# Patient Record
Sex: Male | Born: 1950
Health system: Southern US, Community
[De-identification: ages and names within clinical notes are randomized; demographics above are authoritative.]

## PROBLEM LIST (undated history)

## (undated) DIAGNOSIS — W868XXA Exposure to other electric current, initial encounter: Secondary | ICD-10-CM

## (undated) DIAGNOSIS — F329 Major depressive disorder, single episode, unspecified: Secondary | ICD-10-CM

## (undated) DIAGNOSIS — N200 Calculus of kidney: Secondary | ICD-10-CM

## (undated) DIAGNOSIS — I10 Essential (primary) hypertension: Secondary | ICD-10-CM

## (undated) DIAGNOSIS — F419 Anxiety disorder, unspecified: Secondary | ICD-10-CM

## (undated) DIAGNOSIS — G629 Polyneuropathy, unspecified: Secondary | ICD-10-CM

## (undated) DIAGNOSIS — M51369 Other intervertebral disc degeneration, lumbar region without mention of lumbar back pain or lower extremity pain: Secondary | ICD-10-CM

## (undated) DIAGNOSIS — F32A Depression, unspecified: Secondary | ICD-10-CM

## (undated) DIAGNOSIS — K08109 Complete loss of teeth, unspecified cause, unspecified class: Secondary | ICD-10-CM

## (undated) DIAGNOSIS — M5136 Other intervertebral disc degeneration, lumbar region: Secondary | ICD-10-CM

## (undated) DIAGNOSIS — Z87442 Personal history of urinary calculi: Secondary | ICD-10-CM

## (undated) DIAGNOSIS — K000 Anodontia: Secondary | ICD-10-CM

## (undated) DIAGNOSIS — S0990XA Unspecified injury of head, initial encounter: Secondary | ICD-10-CM

## (undated) DIAGNOSIS — K5790 Diverticulosis of intestine, part unspecified, without perforation or abscess without bleeding: Secondary | ICD-10-CM

## (undated) DIAGNOSIS — E7800 Pure hypercholesterolemia, unspecified: Secondary | ICD-10-CM

## (undated) HISTORY — DX: Polyneuropathy, unspecified: G62.9

## (undated) HISTORY — PX: TONSILLECTOMY: SUR1361

## (undated) HISTORY — PX: HERNIA REPAIR: SHX51

## (undated) HISTORY — PX: OTHER SURGICAL HISTORY: SHX169

---

## 2005-11-10 DIAGNOSIS — Y990 Civilian activity done for income or pay: Secondary | ICD-10-CM

## 2005-11-10 HISTORY — DX: Civilian activity done for income or pay: Y99.0

## 2006-05-12 DIAGNOSIS — W868XXA Exposure to other electric current, initial encounter: Secondary | ICD-10-CM

## 2006-05-12 HISTORY — DX: Exposure to other electric current, initial encounter: W86.8XXA

## 2006-08-25 ENCOUNTER — Ambulatory Visit (HOSPITAL_COMMUNITY): Admission: RE | Admit: 2006-08-25 | Discharge: 2006-08-25 | Payer: Self-pay | Admitting: Pulmonary Disease

## 2006-09-08 ENCOUNTER — Encounter (HOSPITAL_COMMUNITY): Admission: RE | Admit: 2006-09-08 | Discharge: 2006-10-08 | Payer: Self-pay

## 2006-10-12 ENCOUNTER — Encounter (HOSPITAL_COMMUNITY): Admission: RE | Admit: 2006-10-12 | Discharge: 2006-11-09 | Payer: Self-pay

## 2006-11-30 ENCOUNTER — Encounter (HOSPITAL_COMMUNITY): Admission: RE | Admit: 2006-11-30 | Discharge: 2006-12-30 | Payer: Self-pay

## 2008-05-05 ENCOUNTER — Emergency Department (HOSPITAL_COMMUNITY): Admission: EM | Admit: 2008-05-05 | Discharge: 2008-05-05 | Payer: Self-pay | Admitting: Emergency Medicine

## 2008-08-15 ENCOUNTER — Emergency Department (HOSPITAL_COMMUNITY): Admission: EM | Admit: 2008-08-15 | Discharge: 2008-08-15 | Payer: Self-pay | Admitting: Emergency Medicine

## 2011-08-11 LAB — DIFFERENTIAL
Lymphs Abs: 2.7
Monocytes Relative: 3
Neutro Abs: 9.4 — ABNORMAL HIGH
Neutrophils Relative %: 75

## 2011-08-11 LAB — BASIC METABOLIC PANEL
Calcium: 9.1
Chloride: 106
Creatinine, Ser: 0.97
GFR calc Af Amer: >60
Sodium: 140

## 2011-08-11 LAB — CBC
MCV: 89.3
RBC: 5.34
WBC: 12.5 — ABNORMAL HIGH

## 2011-08-11 LAB — POCT CARDIAC MARKERS
CKMB, poc: 1
CKMB, poc: <1 — ABNORMAL LOW
Troponin i, poc: <0.05
Troponin i, poc: <0.05

## 2012-01-07 ENCOUNTER — Encounter (HOSPITAL_COMMUNITY): Payer: Self-pay

## 2012-01-07 ENCOUNTER — Encounter (HOSPITAL_COMMUNITY): Payer: Self-pay | Admitting: Pharmacy Technician

## 2012-01-07 ENCOUNTER — Encounter (HOSPITAL_COMMUNITY)
Admission: RE | Admit: 2012-01-07 | Discharge: 2012-01-07 | Disposition: A | Discharge status: Home / Self Care | Payer: Medicare Other | Source: Ambulatory Visit | Attending: Ophthalmology | Admitting: Ophthalmology

## 2012-01-07 ENCOUNTER — Other Ambulatory Visit: Payer: Self-pay

## 2012-01-07 HISTORY — DX: Unspecified injury of head, initial encounter: S09.90XA

## 2012-01-07 HISTORY — DX: Anxiety disorder, unspecified: F41.9

## 2012-01-07 HISTORY — DX: Pure hypercholesterolemia, unspecified: E78.00

## 2012-01-07 HISTORY — DX: Essential (primary) hypertension: I10

## 2012-01-07 HISTORY — DX: Major depressive disorder, single episode, unspecified: F32.9

## 2012-01-07 HISTORY — DX: Complete loss of teeth, unspecified cause, unspecified class: K08.109

## 2012-01-07 HISTORY — DX: Exposure to other electric current, initial encounter: W86.8XXA

## 2012-01-07 HISTORY — DX: Depression, unspecified: F32.A

## 2012-01-07 HISTORY — DX: Anodontia: K00.0

## 2012-01-07 LAB — HEMOGLOBIN AND HEMATOCRIT, BLOOD: Hemoglobin: 15.3 g/dL (ref 13.0–17.0)

## 2012-01-07 LAB — BASIC METABOLIC PANEL
Calcium: 9 mg/dL (ref 8.4–10.5)
Creatinine, Ser: 0.87 mg/dL (ref 0.50–1.35)
GFR calc Af Amer: >90 mL/min (ref >90)
GFR calc non Af Amer: >90 mL/min (ref >90)

## 2012-01-07 NOTE — Patient Instructions (Addendum)
[address] CORDEL Meza  01/07/2012   Your procedure is scheduled on:  THursday, 01/15/12  Report to [facility] at 0700 AM.  Call this number if you have problems the morning of surgery: [phone]   Remember:   Do not eat food:After Midnight.  May have clear liquids:until Midnight .  Clear liquids include soda, tea, black coffee, apple or grape juice, broth.  Take these medicines the morning of surgery with A SIP OF WATER: lexapro, norvasc, tegretol, wellbutrin   Do not wear jewelry, make-up or nail polish.  Do not wear lotions, powders, or perfumes. You may wear deodorant.  Do not bring valuables to the hospital.  Contacts, dentures or bridgework may not be worn into surgery.    Patients discharged the day of surgery will not be allowed to drive home.  Name and phone number of your driver: driver  Special Instructions: Use eye drops as directed   Please read over the following fact sheets that you were given: Anesthesia Post-op Instructions and Care and Recovery After Surgery   Cataract A cataract is a clouding of the lens of the eye. It is most often related to aging. A cataract is not a "film" over the surface of the eye. The lens is inside the eye and changes size of the pupil. The lens can enlarge to let more light enter the eye in dark environments and contract the size of the pupil to let in bright light. The lens is the part of the eye that helps focus light on the retina. The retina is the eye's light-sensitive layer. It is in the back of the eye that sends visual signals to the brain. In a normal eye, light passes through the lens and gets focused on the retina. To help produce a sharp image, the lens must remain clear. When a lens becomes cloudy, vision is compromised by the degree and nature of the clouding. Certain cataracts make people more near-sighted as they develop, others increase glare, and all reduce vision to some degree or another. A cataract that is so dense that it becomes  milky white and a white opacity can be seen through the pupil. When the white color is seen, it is called a "mature" or "hyper-mature cataract." Such cataracts cause total blindness in the affected eye. The cataract must be removed to prevent damage to the eye itself. Some types of cataracts can cause a secondary disease of the eye, such as certain types of glaucoma. In the early stages, better lighting and eyeglasses may lessen vision problems caused by cataracts. At a certain point, surgery may be needed to improve vision. CAUSES   Aging. However, cataracts may occur at any age, even in newborns.   Certain drugs.   Trauma to the eye.   Certain diseases (such as diabetes).   Inherited or acquired medical syndromes.  SYMPTOMS   Gradual, progressive drop in vision in the affected eye. Cataracts may develop at different rates in each eye. Cataracts may even be in just one eye with the other unaffected.   Cataracts due to trauma may develop quickly, sometimes over a matter or days or even hours. The result is severe and rapid visual loss.  DIAGNOSIS  To detect a cataract, an eye doctor examines the lens. A well developed cataract can be diagnosed without dilating the pupil. Early cataracts and others of a specific nature are best diagnosed with an exam of the eyes with the pupils dilated by drops. TREATMENT  For an early cataract, vision may improve by using different eyeglasses or stronger lighting.   If the above measures do not help, surgery is the only effective treatment. This treatment removes the cloudy lens and replaces it with a substitute lens (Intraocular lens, or IOL). Newly developed IOL technology allows the implanted lens to improve vision both at a distance and up close. Discuss with your eye surgeon about the possibility of still needing glasses. Also discuss how visual coordination between both eyes will be affected.  A cataract needs to be removed only when vision loss  interferes with your everyday activities such as driving, reading or watching TV. You and your eye doctor can make that decision together. In most cases, waiting until you are ready to have cataract surgery will not harm your eye. If you have cataracts in both eyes, only one should be removed at a time. This allows the operated eye to heal and be out of danger from serious problems (such as infection or poor wound healing) before having the other eye undergo surgery.  Sometimes, a cataract should be removed even if it does not cause problems with your vision. For example, a cataract should be removed if it prevents examination or treatment of another eye problem. Just as you cannot see out of the affected eye well, your doctor cannot see into your eye well through a cataract. The vast majority of people who have cataract surgery have better vision afterward. CATARACT REMOVAL There are two primary ways to remove a cataract. Your doctor can explain the differences and help determine which is best for you:  Phacoemulsification (small incision cataract surgery). This involves making a small cut (incision) on the edge of the clear, dome-shaped surface that covers the front of the eye (the cornea). An injection behind the eye or eye drops are given to make this a painless procedure. The doctor then inserts a tiny probe into the eye. This device emits ultrasound waves that soften and break up the cloudy center of the lens so it can be removed by suction. Most cataract surgery is done this way. The cuts are usually so small and performed in such a manner that often no sutures are needed to keep it closed.   Extracapsular surgery. Your doctor makes a slightly longer incision on the side of the cornea. The doctor removes the hard center of the lens. The remainder of the lens is then removed by suction. In some cases, extremely fine sutures are needed which the doctor may, or may not remove in the office after the  surgery.  When an IOL is implanted, it needs no care. It becomes a permanent part of your eye and cannot be seen or felt.  Some people cannot have an IOL. They may have problems during surgery, or maybe they have another eye disease. For these people, a soft contact lens may be suggested. If an IOL or contact lens cannot be used, very powerful and thick glasses are required after surgery. Since vision is very different through such thick glasses, it is important to have your doctor discuss the impact on your vision after any cataract surgery where there is no plan to implant an IOL. The normal lens of the eye is covered by a clear capsule. Both phacoemulsification and extracapsular surgery require that the back surface of this lens capsule be left in place. This helps support IOLs and prevents the IOL from dislocating and falling back into the deeper interior of the eye.  Right after surgery, and often permanently this "posterior capsule" remains clear. In some cases however, it can become cloudy, presenting the same type of visual compromise that the original cataract did since light is again obstructed as it passes through the clear IOL. This condition is often referred to as an "after-cataract." Fortunately, after-cataracts are easily treated using a painless and very fast laser treatment that is performed without anesthesia or incisions. It is done in a matter of minutes in an outpatient environment. Visual improvement is often immediate.  HOME CARE INSTRUCTIONS   Your surgeon will discuss pre and post operative care with you prior to surgery. The majority of people are able to do almost all normal activities right away. Although, it is often advised to avoid strenuous activity for a period of time.   Postoperative drops and careful avoidance of infection will be needed. Many surgeons suggest the use of a protective shield during the first few days after surgery.   There is a very small incidence of  complication from modern cataract surgery, but it can happen. Infection that spreads to the inside of the eye (endophthalmitis) can result in total visual loss and even loss of the eye itself. In extremely rare instances, the inflammation of endophthalmitis can spread to both eyes (sympathetic ophthalmia). Appropriate post-operative care under the close observation of your surgeon is essential to a successful outcome.  SEEK IMMEDIATE MEDICAL CARE IF:   You have any sudden drop of vision in the operated eye.   You have pain in the operated eye.   You see a large number of floating dots in the field of vision in the operated eye.   You see flashing lights, or if a portion of your side vision in any direction appears black (like a curtain being drawn into your field of vision) in the operated eye.  Document Released: 10/27/2005 Document Revised: 07/09/2011 Document Reviewed: 12/13/2007 [facility] Patient Information 2012 [place], [place].

## 2012-01-14 MED ORDER — TETRACAINE HCL 0.5 % OP SOLN
OPHTHALMIC | Status: AC
  Administered 2012-01-15: 1 [drp] via OPHTHALMIC
  Filled 2012-01-14: qty 2

## 2012-01-14 MED ORDER — NEOMYCIN-POLYMYXIN-DEXAMETH 3.5-10000-0.1 OP OINT
TOPICAL_OINTMENT | OPHTHALMIC | Status: AC
  Filled 2012-01-14: qty 3.5

## 2012-01-14 MED ORDER — LIDOCAINE HCL (PF) 1 % IJ SOLN
INTRAMUSCULAR | Status: AC
  Filled 2012-01-14: qty 2

## 2012-01-14 MED ORDER — CYCLOPENTOLATE-PHENYLEPHRINE 0.2-1 % OP SOLN
OPHTHALMIC | Status: AC
  Administered 2012-01-15: 1 [drp] via OPHTHALMIC
  Filled 2012-01-14: qty 2

## 2012-01-14 MED ORDER — LIDOCAINE HCL 3.5 % OP GEL
OPHTHALMIC | Status: AC
  Administered 2012-01-15: 1 via OPHTHALMIC
  Filled 2012-01-14: qty 5

## 2012-01-15 ENCOUNTER — Ambulatory Visit (HOSPITAL_COMMUNITY)
Admission: RE | Admit: 2012-01-15 | Discharge: 2012-01-15 | Disposition: A | Discharge status: Home / Self Care | Payer: Medicare Other | Source: Ambulatory Visit | Attending: Ophthalmology | Admitting: Ophthalmology

## 2012-01-15 ENCOUNTER — Ambulatory Visit (HOSPITAL_COMMUNITY): Payer: Medicare Other | Admitting: Anesthesiology

## 2012-01-15 ENCOUNTER — Encounter (HOSPITAL_COMMUNITY): Payer: Self-pay | Admitting: Anesthesiology

## 2012-01-15 ENCOUNTER — Encounter (HOSPITAL_COMMUNITY): Payer: Self-pay | Admitting: *Deleted

## 2012-01-15 ENCOUNTER — Encounter (HOSPITAL_COMMUNITY)
Admission: RE | Disposition: A | Discharge status: Home / Self Care | Payer: Self-pay | Source: Ambulatory Visit | Attending: Ophthalmology

## 2012-01-15 DIAGNOSIS — I10 Essential (primary) hypertension: Secondary | ICD-10-CM | POA: Insufficient documentation

## 2012-01-15 DIAGNOSIS — Z79899 Other long term (current) drug therapy: Secondary | ICD-10-CM | POA: Insufficient documentation

## 2012-01-15 DIAGNOSIS — Z01812 Encounter for preprocedural laboratory examination: Secondary | ICD-10-CM | POA: Insufficient documentation

## 2012-01-15 DIAGNOSIS — Z01810 Encounter for preprocedural cardiovascular examination: Secondary | ICD-10-CM | POA: Insufficient documentation

## 2012-01-15 DIAGNOSIS — IMO0002 Reserved for concepts with insufficient information to code with codable children: Secondary | ICD-10-CM | POA: Insufficient documentation

## 2012-01-15 HISTORY — PX: CATARACT EXTRACTION W/PHACO: SHX586

## 2012-01-15 SURGERY — PHACOEMULSIFICATION, CATARACT, WITH IOL INSERTION
Anesthesia: Monitor Anesthesia Care | Site: Eye | Laterality: Left | Wound class: Clean

## 2012-01-15 MED ORDER — LIDOCAINE HCL 3.5 % OP GEL
1.0000 "application " | Freq: Once | OPHTHALMIC | Status: AC
  Administered 2012-01-15: 1 via OPHTHALMIC

## 2012-01-15 MED ORDER — TETRACAINE HCL 0.5 % OP SOLN
1.0000 [drp] | OPHTHALMIC | Status: AC
  Administered 2012-01-15 (×3): 1 [drp] via OPHTHALMIC

## 2012-01-15 MED ORDER — PHENYLEPHRINE HCL 2.5 % OP SOLN
OPHTHALMIC | Status: AC
  Administered 2012-01-15: 1 [drp] via OPHTHALMIC
  Filled 2012-01-15: qty 2

## 2012-01-15 MED ORDER — BSS IO SOLN
INTRAOCULAR | Status: DC | PRN
  Administered 2012-01-15: 15 mL via INTRAOCULAR

## 2012-01-15 MED ORDER — PHENYLEPHRINE HCL 2.5 % OP SOLN
1.0000 [drp] | OPHTHALMIC | Status: AC
  Administered 2012-01-15 (×3): 1 [drp] via OPHTHALMIC

## 2012-01-15 MED ORDER — NEOMYCIN-POLYMYXIN-DEXAMETH 0.1 % OP OINT
TOPICAL_OINTMENT | OPHTHALMIC | Status: DC | PRN
  Administered 2012-01-15: 1 via OPHTHALMIC

## 2012-01-15 MED ORDER — POVIDONE-IODINE 5 % OP SOLN
OPHTHALMIC | Status: DC | PRN
  Administered 2012-01-15: 1 via OPHTHALMIC

## 2012-01-15 MED ORDER — EPINEPHRINE HCL 1 MG/ML IJ SOLN
INTRAMUSCULAR | Status: AC
  Filled 2012-01-15: qty 1

## 2012-01-15 MED ORDER — TRYPAN BLUE 0.06 % OP SOLN
OPHTHALMIC | Status: AC
  Filled 2012-01-15: qty 0.5

## 2012-01-15 MED ORDER — MIDAZOLAM HCL 2 MG/2ML IJ SOLN
1.0000 mg | INTRAMUSCULAR | Status: DC | PRN
  Administered 2012-01-15: 2 mg via INTRAVENOUS

## 2012-01-15 MED ORDER — ONDANSETRON HCL 4 MG/2ML IJ SOLN: 4.0000 mg | Freq: Once | INTRAMUSCULAR | Status: DC | PRN

## 2012-01-15 MED ORDER — TRYPAN BLUE 0.06 % OP SOLN
OPHTHALMIC | Status: DC | PRN
  Administered 2012-01-15: 0.5 mL via INTRAOCULAR

## 2012-01-15 MED ORDER — LACTATED RINGERS IV SOLN
INTRAVENOUS | Status: DC
  Administered 2012-01-15: 08:00:00 via INTRAVENOUS

## 2012-01-15 MED ORDER — MIDAZOLAM HCL 2 MG/2ML IJ SOLN
INTRAMUSCULAR | Status: AC
  Administered 2012-01-15: 2 mg via INTRAVENOUS
  Filled 2012-01-15: qty 2

## 2012-01-15 MED ORDER — PROVISC 10 MG/ML IO SOLN: INTRAOCULAR | Status: DC | PRN

## 2012-01-15 MED ORDER — EPINEPHRINE HCL 1 MG/ML IJ SOLN
INTRAOCULAR | Status: DC | PRN
  Administered 2012-01-15: 08:00:00

## 2012-01-15 MED ORDER — LIDOCAINE HCL (PF) 1 % IJ SOLN
INTRAMUSCULAR | Status: DC | PRN
  Administered 2012-01-15: .5 mL

## 2012-01-15 MED ORDER — NA HYALUR & NA CHOND-NA HYALUR 0.55-0.5 ML IO KIT
PACK | INTRAOCULAR | Status: DC | PRN
  Administered 2012-01-15: 1 via OPHTHALMIC

## 2012-01-15 MED ORDER — CYCLOPENTOLATE-PHENYLEPHRINE 0.2-1 % OP SOLN
1.0000 [drp] | OPHTHALMIC | Status: AC
  Administered 2012-01-15 (×3): 1 [drp] via OPHTHALMIC

## 2012-01-15 MED ORDER — FENTANYL CITRATE 0.05 MG/ML IJ SOLN: 25.0000 ug | INTRAMUSCULAR | Status: DC | PRN

## 2012-01-15 MED ORDER — LIDOCAINE 3.5 % OP GEL OPTIME - NO CHARGE
OPHTHALMIC | Status: DC | PRN
  Administered 2012-01-15: 2 [drp] via OPHTHALMIC

## 2012-01-15 SURGICAL SUPPLY — 32 items
CAPSULAR TENSION RING-AMO (OPHTHALMIC RELATED) IMPLANT
CLOTH BEACON ORANGE TIMEOUT ST (SAFETY) ×2 IMPLANT
EYE SHIELD UNIVERSAL CLEAR (GAUZE/BANDAGES/DRESSINGS) ×2 IMPLANT
GLOVE BIO SURGEON STRL SZ 6.5 (GLOVE) IMPLANT
GLOVE BIOGEL PI IND STRL 6.5 (GLOVE) IMPLANT
GLOVE BIOGEL PI IND STRL 7.0 (GLOVE) ×1 IMPLANT
GLOVE BIOGEL PI IND STRL 7.5 (GLOVE) IMPLANT
GLOVE BIOGEL PI INDICATOR 6.5 (GLOVE)
GLOVE BIOGEL PI INDICATOR 7.0 (GLOVE) ×1
GLOVE BIOGEL PI INDICATOR 7.5 (GLOVE)
GLOVE ECLIPSE 6.5 STRL STRAW (GLOVE) IMPLANT
GLOVE ECLIPSE 7.0 STRL STRAW (GLOVE) IMPLANT
GLOVE ECLIPSE 7.5 STRL STRAW (GLOVE) IMPLANT
GLOVE EXAM NITRILE LRG STRL (GLOVE) IMPLANT
GLOVE EXAM NITRILE MD LF STRL (GLOVE) ×2 IMPLANT
GLOVE SKINSENSE NS SZ6.5 (GLOVE)
GLOVE SKINSENSE NS SZ7.0 (GLOVE)
GLOVE SKINSENSE STRL SZ6.5 (GLOVE) IMPLANT
GLOVE SKINSENSE STRL SZ7.0 (GLOVE) IMPLANT
KIT VITRECTOMY (OPHTHALMIC RELATED) IMPLANT
PAD ARMBOARD 7.5X6 YLW CONV (MISCELLANEOUS) ×2 IMPLANT
PROC W NO LENS (INTRAOCULAR LENS)
PROC W SPEC LENS (INTRAOCULAR LENS)
PROCESS W NO LENS (INTRAOCULAR LENS) IMPLANT
PROCESS W SPEC LENS (INTRAOCULAR LENS) IMPLANT
RING MALYGIN (MISCELLANEOUS) IMPLANT
SIGHTPATH CAT PROC W REG LENS (Ophthalmic Related) ×2 IMPLANT
SYR TB 1ML LL NO SAFETY (SYRINGE) ×2 IMPLANT
TAPE SURG TRANSPORE 1 IN (GAUZE/BANDAGES/DRESSINGS) ×1 IMPLANT
TAPE SURGICAL TRANSPORE 1 IN (GAUZE/BANDAGES/DRESSINGS) ×1
VISCOELASTIC ADDITIONAL (OPHTHALMIC RELATED) IMPLANT
WATER STERILE IRR 250ML POUR (IV SOLUTION) ×2 IMPLANT

## 2012-01-15 NOTE — Anesthesia Preprocedure Evaluation (Signed)
Anesthesia Evaluation  Patient identified by MRN, date of birth, ID band  Reviewed: Allergy & Precautions, NPO status , Patient's Chart, lab work & pertinent test results  Airway Mallampati: II      Dental  (+) Edentulous Upper and Edentulous Lower   Pulmonary Current Smoker,  breath sounds clear to auscultation        Cardiovascular hypertension, Pt. on medications Rhythm:Regular     Neuro/Psych PSYCHIATRIC DISORDERS Anxiety Depression    GI/Hepatic   Endo/Other    Renal/GU      Musculoskeletal   Abdominal   Peds  Hematology   Anesthesia Other Findings   Reproductive/Obstetrics                           Anesthesia Physical Anesthesia Plan  ASA: III  Anesthesia Plan: MAC   Post-op Pain Management:    Induction: Intravenous  Airway Management Planned: Nasal Cannula  Additional Equipment:   Intra-op Plan:   Post-operative Plan:   Informed Consent: I have reviewed the patients History and Physical, chart, labs and discussed the procedure including the risks, benefits and alternatives for the proposed anesthesia with the patient or authorized representative who has indicated his/her understanding and acceptance.     Plan Discussed with:   Anesthesia Plan Comments:         Anesthesia Quick Evaluation

## 2012-01-15 NOTE — Anesthesia Postprocedure Evaluation (Signed)
  Anesthesia Post-op Note  Patient: Robert Meza  Procedure(s) Performed: Procedure(s) (LRB): CATARACT EXTRACTION PHACO AND INTRAOCULAR LENS PLACEMENT (IOC) (Left)  Patient Location:  Short Stay  Anesthesia Type: MAC  Level of Consciousness: awake  Airway and Oxygen Therapy: Patient Spontanous Breathing  Post-op Pain: none  Post-op Assessment: Post-op Vital signs reviewed, Patient's Cardiovascular Status Stable, Respiratory Function Stable, Patent Airway, No signs of Nausea or vomiting and Pain level controlled  Post-op Vital Signs: Reviewed and stable  Complications: No apparent anesthesia complications

## 2012-01-15 NOTE — H&P (Signed)
I have reviewed the H&P, the patient was re-examined, and I have identified no interval changes in medical condition and plan of care since the history and physical of record  

## 2012-01-15 NOTE — Transfer of Care (Signed)
Immediate Anesthesia Transfer of Care Note  Patient: Robert Meza  Procedure(s) Performed: Procedure(s) (LRB): CATARACT EXTRACTION PHACO AND INTRAOCULAR LENS PLACEMENT (IOC) (Left)  Patient Location: Shortstay  Anesthesia Type: MAC  Level of Consciousness: awake  Airway & Oxygen Therapy: Patient Spontanous Breathing   Post-op Assessment: Report given to PACU RN, Post -op Vital signs reviewed and stable and Patient moving all extremities  Post vital signs: Reviewed and stable  Complications: No apparent anesthesia complications

## 2012-01-15 NOTE — Brief Op Note (Signed)
Pre-Op Dx: Cataract OS Post-Op Dx: Cataract OS Surgeon: ,  Anesthesia: Topical with MAC Implant: B&L enVista Specimen: None Complications: None 

## 2012-01-15 NOTE — Anesthesia Procedure Notes (Addendum)
Procedure Name: MAC Date/Time: 01/15/2012 8:16 AM Performed by: Minerva Areola Pre-anesthesia Checklist: Patient identified, Emergency Drugs available, Suction available, Timeout performed and Patient being monitored Patient Re-evaluated:Patient Re-evaluated prior to inductionOxygen Delivery Method: Nasal Cannula

## 2012-01-16 NOTE — Op Note (Signed)
[name], Robert Meza                 ACCOUNT NO.:  [id number]  MEDICAL RECORD NO.:  [id number]  LOCATION:  [facility]                          FACILITY:  [facility]  PHYSICIAN:  Susanne Greenhouse, MD       DATE OF BIRTH:  Aug 16, 1951  DATE OF PROCEDURE:  01/15/2012 DATE OF DISCHARGE:  01/15/2012                              OPERATIVE REPORT   PREOPERATIVE DIAGNOSIS:  Mature cataract, left eye, diagnosis code 366.17.  POSTOPERATIVE DIAGNOSIS:  Mature cataract, left eye, diagnosis code 366.17.  SURGEON:  Susanne Greenhouse, MD  ANESTHESIA:  Topical with monitored anesthesia care.  DESCRIPTION OF THE OPERATION:  In the preoperative holding area, dilating drops and viscous lidocaine were placed into the left eye.  The patient was then brought to the operating room where he was prepped and draped.  Beginning with a #75 blade, a paracentesis port was made at the surgeon's 2 o'clock position.  The anterior chamber was then filled with a 1% nonpreserved lidocaine solution.  This was followed by filling the anterior chamber with VisionBlue due to a poor red reflex.  The VisionBlue was then displaced from the anterior chamber with balanced salt solution.  The anterior chamber was then filled with Provisc.  A 2.4 mm keratome blade was then used to make a clear corneal incision at the temporal limbus.  A bent cystotome needle and Utrata forceps were used to create a continuous tear capsulotomy.  Hydrodissection was performed with balanced salt solution and a fine cannula.  The lens nucleus was then removed using phacoemulsification and a quadrant cracking technique.  Residual cortex was removed with irrigation and aspiration.  The capsular bag and anterior chamber were refilled with Provisc and a posterior chamber lens was placed into the capsular bag without difficulty using a lens injecting system.  The Provisc was then removed from the capsular bag and anterior chamber with irrigation and aspiration.  Stromal  hydration of the main incision and paracentesis ports were performed with balanced salt solution and a fine cannula. The wounds were tested for leak which was negative.  The patient tolerated the procedure well.  There were no operative complications and he was returned to the recovery area in satisfactory condition.  There were no surgical specimens.  Prosthetic device used is a Actuary enVista posterior lens model MX 60, power of 18.0, serial number is [id number].          ______________________________ Susanne Greenhouse, MD    KEH/MEDQ  D:  01/15/2012  T:  01/16/2012  Job:  [id number]

## 2012-01-19 ENCOUNTER — Encounter (HOSPITAL_COMMUNITY): Payer: Self-pay | Admitting: Ophthalmology

## 2012-05-12 DIAGNOSIS — S0990XA Unspecified injury of head, initial encounter: Secondary | ICD-10-CM

## 2012-05-12 HISTORY — DX: Unspecified injury of head, initial encounter: S09.90XA

## 2012-08-29 ENCOUNTER — Emergency Department (HOSPITAL_COMMUNITY): Payer: Medicare Other

## 2012-08-29 ENCOUNTER — Emergency Department (HOSPITAL_COMMUNITY)
Admission: EM | Admit: 2012-08-29 | Discharge: 2012-08-29 | Disposition: A | Discharge status: Home / Self Care | Payer: Medicare Other | Attending: Emergency Medicine | Admitting: Emergency Medicine

## 2012-08-29 ENCOUNTER — Encounter (HOSPITAL_COMMUNITY): Payer: Self-pay

## 2012-08-29 DIAGNOSIS — N201 Calculus of ureter: Secondary | ICD-10-CM | POA: Diagnosis not present

## 2012-08-29 DIAGNOSIS — M51379 Other intervertebral disc degeneration, lumbosacral region without mention of lumbar back pain or lower extremity pain: Secondary | ICD-10-CM | POA: Insufficient documentation

## 2012-08-29 DIAGNOSIS — N23 Unspecified renal colic: Secondary | ICD-10-CM

## 2012-08-29 DIAGNOSIS — M5137 Other intervertebral disc degeneration, lumbosacral region: Secondary | ICD-10-CM | POA: Insufficient documentation

## 2012-08-29 DIAGNOSIS — Z79899 Other long term (current) drug therapy: Secondary | ICD-10-CM | POA: Diagnosis not present

## 2012-08-29 DIAGNOSIS — I10 Essential (primary) hypertension: Secondary | ICD-10-CM | POA: Diagnosis not present

## 2012-08-29 DIAGNOSIS — R109 Unspecified abdominal pain: Secondary | ICD-10-CM | POA: Insufficient documentation

## 2012-08-29 HISTORY — DX: Other intervertebral disc degeneration, lumbar region: M51.36

## 2012-08-29 HISTORY — DX: Diverticulosis of intestine, part unspecified, without perforation or abscess without bleeding: K57.90

## 2012-08-29 HISTORY — DX: Other intervertebral disc degeneration, lumbar region without mention of lumbar back pain or lower extremity pain: M51.369

## 2012-08-29 HISTORY — DX: Calculus of kidney: N20.0

## 2012-08-29 LAB — URINE MICROSCOPIC-ADD ON

## 2012-08-29 LAB — URINALYSIS, ROUTINE W REFLEX MICROSCOPIC
Glucose, UA: NEGATIVE mg/dL
Leukocytes, UA: NEGATIVE
Specific Gravity, Urine: >1.03 — ABNORMAL HIGH (ref 1.005–1.030)
pH: 5.5 (ref 5.0–8.0)

## 2012-08-29 MED ORDER — ONDANSETRON HCL 4 MG/2ML IJ SOLN
4.0000 mg | INTRAMUSCULAR | Status: DC | PRN
  Administered 2012-08-29: 4 mg via INTRAVENOUS
  Filled 2012-08-29: qty 2

## 2012-08-29 MED ORDER — HYDROCODONE-ACETAMINOPHEN 5-325 MG PO TABS: ORAL_TABLET | ORAL | Status: DC

## 2012-08-29 MED ORDER — ONDANSETRON HCL 4 MG PO TABS: 4.0000 mg | ORAL_TABLET | Freq: Three times a day (TID) | ORAL | Status: DC | PRN

## 2012-08-29 MED ORDER — SODIUM CHLORIDE 0.9 % IV SOLN
INTRAVENOUS | Status: DC
  Administered 2012-08-29: 14:00:00 via INTRAVENOUS

## 2012-08-29 MED ORDER — HYDROMORPHONE HCL PF 1 MG/ML IJ SOLN
1.0000 mg | INTRAMUSCULAR | Status: DC | PRN
  Administered 2012-08-29: 1 mg via INTRAVENOUS
  Filled 2012-08-29: qty 1

## 2012-08-29 NOTE — ED Notes (Signed)
Pt reports right flank pain that started this am. Denies any nausea or vomiting, no fever, +dysuria.  H/o stones.

## 2012-08-29 NOTE — ED Notes (Signed)
Pt presents with rt flank pain since 930 this morning. Pt reports has a history of kidney stones, last one was 3 years prior.  Pt states pain has "eased off some". Pt reports some difficulty urinating, and some burning. Nauseated this morning, denies emesis and fever. Pt appears very uncomfortable at this time.  Transported to CT scan via stretcher. Family at bedside.

## 2012-08-29 NOTE — ED Provider Notes (Signed)
History     CSN: [id number]  Arrival date & time 08/29/12  1241   First MD Initiated Contact with Patient 08/29/12 1309      Chief Complaint  Patient presents with  . Flank Pain     HPI Pt was seen at 1315.  Per pt, c/o sudden onset and persistence of constant right side flank "pain" that began this morning approx 0900.  Describes the pain as "like my last kidney stone," and radiating into his right groin area.  Has been associated with nausea.  Endorses his last kidney stone was approx 3-4 years ago.  Denies vomiting/diarrhea, no SOB/CP, no testicular pain/swelling, no dysuria/hematuria, no abd pain, no rash, no fevers.    Past Medical History  Diagnosis Date  . Hypertension   . Electric injury 05/12/2006    electricuted while doing electrical work   . Head injury 05/12/12    fall after electicution  . Hypercholesteremia   . Depression   . Anxiety   . Edentulous   . Kidney stone   . Diverticulosis   . DDD (degenerative disc disease), lumbar     Past Surgical History  Procedure Date  . Hernia repair age 45    right inguinal  . Left ring finger amputation     after electricution   . Tonsillectomy age 51    [facility]  . Cataract extraction w/phaco 01/15/2012    Procedure: CATARACT EXTRACTION PHACO AND INTRAOCULAR LENS PLACEMENT (IOC);  Surgeon: Gemma Payor, MD;  Location: [facility];  Service: Ophthalmology;  Laterality: Left;  CDE 16.58    Family History  Problem Relation Age of Onset  . Anesthesia problems Neg Hx   . Hypotension Neg Hx   . Malignant hyperthermia Neg Hx   . Pseudochol deficiency Neg Hx     History  Substance Use Topics  . Smoking status: Current Every Day Smoker -- 1.0 packs/day for 25 years    Types: Cigarettes  . Smokeless tobacco: Not on file  . Alcohol Use: No      Review of Systems ROS: Statement: All systems negative except as marked or noted in the HPI; Constitutional: Negative for fever and chills. ; ; Eyes: Negative for eye pain, redness and  discharge. ; ; ENMT: Negative for ear pain, hoarseness, nasal congestion, sinus pressure and sore throat. ; ; Cardiovascular: Negative for chest pain, palpitations, diaphoresis, dyspnea and peripheral edema. ; ; Respiratory: Negative for cough, wheezing and stridor. ; ; Gastrointestinal: +nausea. Negative for vomiting, diarrhea, abdominal pain, blood in stool, hematemesis, jaundice and rectal bleeding. . ; ; Genitourinary: +flank pain. Negative for dysuria and hematuria. ; ; Genital:  No penile drainage or rash, no testicular pain or swelling, no scrotal rash or swelling.;; Musculoskeletal: Negative for back pain and neck pain. Negative for swelling and trauma.; ; Skin: Negative for pruritus, rash, abrasions, blisters, bruising and skin lesion.; ; Neuro: Negative for headache, lightheadedness and neck stiffness. Negative for weakness, altered level of consciousness , altered mental status, extremity weakness, paresthesias, involuntary movement, seizure and syncope.       Allergies  Amoxicillin  Home Medications   Current Outpatient Rx  Name Route Sig Dispense Refill  . AMLODIPINE BESYLATE 10 MG PO TABS Oral Take 10 mg by mouth daily.    . BUPROPION HCL ER (XL) 150 MG PO TB24 Oral Take 150 mg by mouth daily.    Marland Kitchen CARBAMAZEPINE ER 200 MG PO TB12 Oral Take 600 mg by mouth at bedtime.    Marland Kitchen  ESCITALOPRAM OXALATE 10 MG PO TABS Oral Take 10 mg by mouth daily.    Marland Kitchen GABAPENTIN 300 MG PO CAPS Oral Take 900 mg by mouth at bedtime.    Marland Kitchen HYDROCODONE-ACETAMINOPHEN 5-325 MG PO TABS Oral Take 1 tablet by mouth every 6 (six) hours as needed. Pain.    Marland Kitchen MIRTAZAPINE 15 MG PO TABS Oral Take 30 mg by mouth at bedtime.      BP 152/88  Pulse 73  Temp 97.4 F (36.3 C) (Oral)  Resp 20  Ht 5\' 9"  (1.753 m)  Wt 180 lb (81.647 kg)  BMI 26.58 kg/m2  SpO2 97%  Physical Exam 1320: Physical examination:  Nursing notes reviewed; Vital signs and O2 SAT reviewed;  Constitutional: Well developed, Well nourished, Well  hydrated, Uncomfortable appearing; Head:  Normocephalic, atraumatic; Eyes: EOMI, PERRL, No scleral icterus; ENMT: Mouth and pharynx normal, Mucous membranes moist; Neck: Supple, Full range of motion, No lymphadenopathy; Cardiovascular: Regular rate and rhythm, No gallop; Respiratory: Breath sounds clear & equal bilaterally, No wheezes.  Speaking full sentences with ease, Normal respiratory effort/excursion; Chest: Nontender, Movement normal; Abdomen: Soft, Nontender, Nondistended, Normal bowel sounds; Genitourinary: No CVA tenderness, Genital exam performed with pt permission and male ED Tech chaparone present during exam.  No perineal erythema.  No penile lesions or drainage.  No scrotal erythema, edema or tenderness to palp.  Normal testicular lie.  No testicular tenderness to palp.  +cremasteric reflexes bilat.  No inguinal LAN or palpable masses.; Spine:  No midline CS, TS, LS tenderness.  +TTP right lumbar paraspinal muscles;; Extremities: Pulses normal, No tenderness, No edema, No calf edema or asymmetry.; Neuro: AA&Ox3, Major CN grossly intact.  Speech clear. No gross focal motor or sensory deficits in extremities.; Skin: Color normal, Warm, Dry.   ED Course  Procedures   MDM  MDM Reviewed: nursing note and vitals Interpretation: CT scan and labs   Results for orders placed during the hospital encounter of 08/29/12  URINALYSIS, ROUTINE W REFLEX MICROSCOPIC      Component Value Range   Color, Urine YELLOW  YELLOW   APPearance CLEAR  CLEAR   Specific Gravity, Urine >1.030 (*) 1.005 - 1.030   pH 5.5  5.0 - 8.0   Glucose, UA NEGATIVE  NEGATIVE mg/dL   Hgb urine dipstick MODERATE (*) NEGATIVE   Bilirubin Urine NEGATIVE  NEGATIVE   Ketones, ur NEGATIVE  NEGATIVE mg/dL   Protein, ur NEGATIVE  NEGATIVE mg/dL   Urobilinogen, UA 0.2  0.0 - 1.0 mg/dL   Nitrite NEGATIVE  NEGATIVE   Leukocytes, UA NEGATIVE  NEGATIVE  URINE MICROSCOPIC-ADD ON      Component Value Range   WBC, UA 7-10  <3  WBC/hpf   RBC / HPF 7-10  <3 RBC/hpf   Bacteria, UA FEW (*) RARE    Ct Abdomen Pelvis Wo Contrast 08/29/2012  *RADIOLOGY REPORT*  Clinical Data: Dysuria, right flank pain  CT ABDOMEN AND PELVIS WITHOUT CONTRAST  Technique:  Multidetector CT imaging of the abdomen and pelvis was performed following the standard protocol without intravenous contrast.  Comparison: 05/05/2008  Findings: Sagittal images of the spine shows degenerative changes at L5-S1 level.  The lung bases are unremarkable.  Unenhanced liver shows no biliary ductal dilatation.  No calcified gallstones are noted within gallbladder.  Unenhanced pancreas, spleen and adrenal glands are unremarkable. Unenhanced kidneys are symmetrical in size.  No nephrolithiasis. No hydronephrosis or hydroureter.  There is mild right perinephric and periureteral stranding.  No proximal or  mid ureteral calculi are noted bilaterally.  No aortic aneurysm.  No small bowel obstruction.  No ascites or free air.  No adenopathy.  No pericecal inflammation.  The terminal ileum is unremarkable.  Bilateral distal ureter is unremarkable.  In axial image 75 and coronal image 68 there is a 2 mm calcified calculus in the right UVJ region/right urinary bladder wall.  This is probable nonobstructive.  Prostate gland and seminal vesicles are unremarkable.  IMPRESSION:  1.  No nephrolithiasis.  No hydronephrosis or hydroureter.  Mild right perinephric and periureteral stranding. 2.  There is a 2 mm calcified probable nonobstructive calculus in the right UVJ/right urinary bladder wall.  No distention of distal ureter. 3.  No pericecal inflammation. 4.  Degenerative changes lumbar spine and L5 S1 level.   Original Report Authenticated By: Natasha Mead, M.D.      1610:  Pt feels "much better now" and wants to go home.  Has tol PO well while in the ED without N/V.  Dx and testing d/w pt and family.  Questions answered.  Verb understanding, agreeable to d/c home with outpt  f/u.          Laray Anger, DO 08/30/12 [phone]

## 2012-08-30 LAB — URINE CULTURE
Colony Count: NO GROWTH
Culture: NO GROWTH

## 2014-01-19 ENCOUNTER — Ambulatory Visit (HOSPITAL_COMMUNITY)
Admission: RE | Admit: 2014-01-19 | Discharge: 2014-01-19 | Disposition: A | Discharge status: Home / Self Care | Payer: Medicare Other | Source: Ambulatory Visit | Attending: Pulmonary Disease | Admitting: Pulmonary Disease

## 2014-01-19 ENCOUNTER — Other Ambulatory Visit (HOSPITAL_COMMUNITY): Payer: Self-pay | Admitting: Pulmonary Disease

## 2014-01-19 DIAGNOSIS — M545 Low back pain, unspecified: Secondary | ICD-10-CM | POA: Diagnosis not present

## 2014-01-19 DIAGNOSIS — R05 Cough: Secondary | ICD-10-CM

## 2014-01-19 DIAGNOSIS — Z5181 Encounter for therapeutic drug level monitoring: Secondary | ICD-10-CM | POA: Diagnosis not present

## 2014-01-19 DIAGNOSIS — E785 Hyperlipidemia, unspecified: Secondary | ICD-10-CM | POA: Diagnosis not present

## 2014-01-19 DIAGNOSIS — L989 Disorder of the skin and subcutaneous tissue, unspecified: Secondary | ICD-10-CM | POA: Diagnosis not present

## 2014-01-19 DIAGNOSIS — R059 Cough, unspecified: Secondary | ICD-10-CM | POA: Insufficient documentation

## 2014-01-19 DIAGNOSIS — Z87891 Personal history of nicotine dependence: Secondary | ICD-10-CM | POA: Insufficient documentation

## 2014-01-19 DIAGNOSIS — R053 Chronic cough: Secondary | ICD-10-CM

## 2014-01-19 DIAGNOSIS — F3289 Other specified depressive episodes: Secondary | ICD-10-CM | POA: Diagnosis not present

## 2014-01-19 DIAGNOSIS — F411 Generalized anxiety disorder: Secondary | ICD-10-CM | POA: Diagnosis not present

## 2014-01-19 DIAGNOSIS — Z79899 Other long term (current) drug therapy: Secondary | ICD-10-CM | POA: Diagnosis not present

## 2014-02-01 DIAGNOSIS — D235 Other benign neoplasm of skin of trunk: Secondary | ICD-10-CM | POA: Diagnosis not present

## 2014-02-01 DIAGNOSIS — L570 Actinic keratosis: Secondary | ICD-10-CM | POA: Diagnosis not present

## 2014-02-01 DIAGNOSIS — C44621 Squamous cell carcinoma of skin of unspecified upper limb, including shoulder: Secondary | ICD-10-CM | POA: Diagnosis not present

## 2014-09-08 ENCOUNTER — Emergency Department (HOSPITAL_COMMUNITY): Payer: Medicare Other

## 2014-09-08 ENCOUNTER — Encounter (HOSPITAL_COMMUNITY): Payer: Self-pay | Admitting: Emergency Medicine

## 2014-09-08 ENCOUNTER — Emergency Department (HOSPITAL_COMMUNITY)
Admission: EM | Admit: 2014-09-08 | Discharge: 2014-09-08 | Disposition: A | Discharge status: Home / Self Care | Payer: Medicare Other | Attending: Emergency Medicine | Admitting: Emergency Medicine

## 2014-09-08 DIAGNOSIS — Z720 Tobacco use: Secondary | ICD-10-CM | POA: Insufficient documentation

## 2014-09-08 DIAGNOSIS — Z87442 Personal history of urinary calculi: Secondary | ICD-10-CM | POA: Diagnosis not present

## 2014-09-08 DIAGNOSIS — R1030 Lower abdominal pain, unspecified: Secondary | ICD-10-CM | POA: Diagnosis not present

## 2014-09-08 DIAGNOSIS — R112 Nausea with vomiting, unspecified: Secondary | ICD-10-CM | POA: Diagnosis not present

## 2014-09-08 DIAGNOSIS — Z880 Allergy status to penicillin: Secondary | ICD-10-CM | POA: Insufficient documentation

## 2014-09-08 DIAGNOSIS — I10 Essential (primary) hypertension: Secondary | ICD-10-CM | POA: Insufficient documentation

## 2014-09-08 DIAGNOSIS — R109 Unspecified abdominal pain: Secondary | ICD-10-CM | POA: Insufficient documentation

## 2014-09-08 DIAGNOSIS — Z8739 Personal history of other diseases of the musculoskeletal system and connective tissue: Secondary | ICD-10-CM | POA: Insufficient documentation

## 2014-09-08 DIAGNOSIS — Z8639 Personal history of other endocrine, nutritional and metabolic disease: Secondary | ICD-10-CM | POA: Insufficient documentation

## 2014-09-08 DIAGNOSIS — Z8719 Personal history of other diseases of the digestive system: Secondary | ICD-10-CM | POA: Insufficient documentation

## 2014-09-08 DIAGNOSIS — R10A1 Flank pain, right side: Secondary | ICD-10-CM

## 2014-09-08 DIAGNOSIS — Z9889 Other specified postprocedural states: Secondary | ICD-10-CM | POA: Insufficient documentation

## 2014-09-08 DIAGNOSIS — Z87828 Personal history of other (healed) physical injury and trauma: Secondary | ICD-10-CM | POA: Insufficient documentation

## 2014-09-08 DIAGNOSIS — Z79899 Other long term (current) drug therapy: Secondary | ICD-10-CM | POA: Insufficient documentation

## 2014-09-08 LAB — URINALYSIS, ROUTINE W REFLEX MICROSCOPIC
Bilirubin Urine: NEGATIVE
GLUCOSE, UA: NEGATIVE mg/dL
KETONES UR: NEGATIVE mg/dL
LEUKOCYTES UA: NEGATIVE
Nitrite: NEGATIVE
PROTEIN: NEGATIVE mg/dL
Specific Gravity, Urine: >1.03 — ABNORMAL HIGH (ref 1.005–1.030)
Urobilinogen, UA: 0.2 mg/dL (ref 0.0–1.0)
pH: 5.5 (ref 5.0–8.0)

## 2014-09-08 LAB — URINE MICROSCOPIC-ADD ON

## 2014-09-08 MED ORDER — SODIUM CHLORIDE 0.9 % IV BOLUS (SEPSIS)
500.0000 mL | Freq: Once | INTRAVENOUS | Status: AC
  Administered 2014-09-08: 500 mL via INTRAVENOUS

## 2014-09-08 MED ORDER — MORPHINE SULFATE 4 MG/ML IJ SOLN
4.0000 mg | Freq: Once | INTRAMUSCULAR | Status: AC
  Administered 2014-09-08: 4 mg via INTRAVENOUS
  Filled 2014-09-08: qty 1

## 2014-09-08 MED ORDER — ONDANSETRON HCL 4 MG/2ML IJ SOLN
4.0000 mg | Freq: Once | INTRAMUSCULAR | Status: AC
  Administered 2014-09-08: 4 mg via INTRAVENOUS
  Filled 2014-09-08: qty 2

## 2014-09-08 MED ORDER — KETOROLAC TROMETHAMINE 30 MG/ML IJ SOLN
30.0000 mg | Freq: Once | INTRAMUSCULAR | Status: AC
  Administered 2014-09-08: 30 mg via INTRAVENOUS
  Filled 2014-09-08: qty 1

## 2014-09-08 MED ORDER — PROMETHAZINE HCL 25 MG PO TABS: 25.0000 mg | ORAL_TABLET | Freq: Four times a day (QID) | ORAL | Status: DC | PRN

## 2014-09-08 MED ORDER — TAMSULOSIN HCL 0.4 MG PO CAPS: 0.4000 mg | ORAL_CAPSULE | Freq: Every day | ORAL | Status: DC

## 2014-09-08 MED ORDER — ONDANSETRON HCL 4 MG/2ML IJ SOLN: 4.0000 mg | Freq: Once | INTRAMUSCULAR | Status: DC

## 2014-09-08 MED ORDER — OXYCODONE-ACETAMINOPHEN 5-325 MG PO TABS: 2.0000 | ORAL_TABLET | ORAL | Status: DC | PRN

## 2014-09-08 NOTE — Discharge Instructions (Signed)
Medication for pain, nausea, to increase urinary flow. Return if worse

## 2014-09-08 NOTE — ED Provider Notes (Signed)
CSN: [id number]     Arrival date & time 09/08/14  1062 History  This chart was scribed for Nat Christen, MD by Steva Colder, ED Scribe. The patient was seen in room [facility] at 7:07 AM.     Chief Complaint  Patient presents with  . Flank Pain    The history is provided by the patient. No language interpreter was used.   HPI Comments: Robert Meza is a 63 y.o. male with a medical hx of kidney stones who presents to the Emergency Department complaining of right sided flank pain onset this morning at 4 AM PTA. He states that the pain is radiating into his groin area. He states that he is not able to urinate. He states that this is his third time in four years. He states that he is having associated symptoms of urinary retention. He states that he has tried oxyCODONE with no relief for his symptoms. He states that he threw up on the way to the ED and the pain has eased up some. He denies hematuria and any other symptoms. His PCP is Alonza Bogus, MD   Past Medical History  Diagnosis Date  . Hypertension   . Electric injury 05/12/2006    electricuted while doing electrical work   . Head injury 05/12/12    fall after electicution  . Hypercholesteremia   . Depression   . Anxiety   . Edentulous   . Kidney stone   . Diverticulosis   . DDD (degenerative disc disease), lumbar    Past Surgical History  Procedure Laterality Date  . Hernia repair  age 45    right inguinal  . Left ring finger amputation      after electricution   . Tonsillectomy  age 46    [facility]  . Cataract extraction w/phaco  01/15/2012    Procedure: CATARACT EXTRACTION PHACO AND INTRAOCULAR LENS PLACEMENT (IOC);  Surgeon: Tonny Branch, MD;  Location: [facility];  Service: Ophthalmology;  Laterality: Left;  CDE 16.58   Family History  Problem Relation Age of Onset  . Anesthesia problems Neg Hx   . Hypotension Neg Hx   . Malignant hyperthermia Neg Hx   . Pseudochol deficiency Neg Hx    History  Substance Use Topics  .  Smoking status: Current Every Day Smoker -- 1.00 packs/day for 25 years    Types: Cigarettes  . Smokeless tobacco: Not on file  . Alcohol Use: No    Review of Systems  Genitourinary: Positive for flank pain (right side) and difficulty urinating. Negative for hematuria.  All other systems reviewed and are negative.    Allergies  Amoxicillin  Home Medications   Prior to Admission medications   Medication Sig Start Date End Date Taking? Authorizing Provider  carbamazepine (TEGRETOL XR) 200 MG 12 hr tablet Take 600 mg by mouth at bedtime.    Historical Provider, MD  oxyCODONE-acetaminophen (PERCOCET) 5-325 MG per tablet Take 2 tablets by mouth every 4 (four) hours as needed. 09/08/14   Nat Christen, MD  promethazine (PHENERGAN) 25 MG tablet Take 1 tablet (25 mg total) by mouth every 6 (six) hours as needed. 09/08/14   Nat Christen, MD  tamsulosin (FLOMAX) 0.4 MG CAPS capsule Take 1 capsule (0.4 mg total) by mouth daily. 09/08/14   Nat Christen, MD   BP 146/96  Pulse 65  Temp(Src) 97.5 F (36.4 C) (Oral)  Resp 16  Ht 5\' 9"  (1.753 m)  Wt 175 lb (79.379 kg)  BMI 25.83  kg/m2  SpO2 98%  Physical Exam  Nursing note and vitals reviewed. Constitutional: He is oriented to person, place, and time. He appears well-developed and well-nourished.  HENT:  Head: Normocephalic and atraumatic.  Eyes: Conjunctivae and EOM are normal. Pupils are equal, round, and reactive to light.  Neck: Normal range of motion. Neck supple.  Cardiovascular: Normal rate, regular rhythm and normal heart sounds.   Pulmonary/Chest: Effort normal and breath sounds normal.  Abdominal: Soft. Bowel sounds are normal.  Musculoskeletal: Normal range of motion. He exhibits tenderness.  Tenderness at the right flank  Neurological: He is alert and oriented to person, place, and time.  Skin: Skin is warm and dry.  Psychiatric: He has a normal mood and affect. His behavior is normal.    ED Course  Procedures (including  critical care time) DIAGNOSTIC STUDIES: Oxygen Saturation is 98% on room air, normal by my interpretation.    COORDINATION OF CARE: 7:12 AM-Discussed treatment plan which includes KUB, UA analysis, and pain management with pt at bedside and pt agreed to plan.   Results for orders placed during the hospital encounter of 09/08/14  URINALYSIS, ROUTINE W REFLEX MICROSCOPIC      Result Value Ref Range   Color, Urine YELLOW  YELLOW   APPearance CLEAR  CLEAR   Specific Gravity, Urine >1.030 (*) 1.005 - 1.030   pH 5.5  5.0 - 8.0   Glucose, UA NEGATIVE  NEGATIVE mg/dL   Hgb urine dipstick LARGE (*) NEGATIVE   Bilirubin Urine NEGATIVE  NEGATIVE   Ketones, ur NEGATIVE  NEGATIVE mg/dL   Protein, ur NEGATIVE  NEGATIVE mg/dL   Urobilinogen, UA 0.2  0.0 - 1.0 mg/dL   Nitrite NEGATIVE  NEGATIVE   Leukocytes, UA NEGATIVE  NEGATIVE  URINE MICROSCOPIC-ADD ON      Result Value Ref Range   RBC / HPF TOO NUMEROUS TO COUNT  <3 RBC/hpf   Dg Abd 1 View  09/08/2014   CLINICAL DATA:  63 year old male with acute right flank pain nausea vomiting and difficulty urinating since 4 a.m. Initial encounter.  EXAM: ABDOMEN - 1 VIEW  COMPARISON:  CT Abdomen and Pelvis 07/2012.  FINDINGS: Supine view of the abdomen at 0805 hrs. No nephrolithiasis. Stable left greater than right iliac artery vascular calcifications. No definite ureteral calculus. Visualized bowel gas pattern is non obstructed. No acute osseous abnormality identified.  IMPRESSION: No plain radiographic evidence of urologic calculus. Non obstructed bowel gas pattern.   Electronically Signed   By: Lars Pinks M.D.   On: 09/08/2014 08:22     Labs Review Labs Reviewed  URINALYSIS, ROUTINE W REFLEX MICROSCOPIC - Abnormal; Notable for the following:    Specific Gravity, Urine >1.030 (*)    Hgb urine dipstick LARGE (*)    All other components within normal limits  URINE MICROSCOPIC-ADD ON    Imaging Review Dg Abd 1 View  09/08/2014   CLINICAL DATA:   63 year old male with acute right flank pain nausea vomiting and difficulty urinating since 4 a.m. Initial encounter.  EXAM: ABDOMEN - 1 VIEW  COMPARISON:  CT Abdomen and Pelvis 07/2012.  FINDINGS: Supine view of the abdomen at 0805 hrs. No nephrolithiasis. Stable left greater than right iliac artery vascular calcifications. No definite ureteral calculus. Visualized bowel gas pattern is non obstructed. No acute osseous abnormality identified.  IMPRESSION: No plain radiographic evidence of urologic calculus. Non obstructed bowel gas pattern.   Electronically Signed   By: Lars Pinks M.D.   On:  09/08/2014 08:22     EKG Interpretation None      MDM   Final diagnoses:  Right flank pain    History and physical consistent with kidney stone. Will try to avoid CT scan secondary to radiation exposure. He feels better after pain management. Urinalysis shows hemoglobin. Discharge medications Percocet, Phenergan 25 mg, Flomax 0.4 mg  I personally performed the services described in this documentation, which was scribed in my presence. The recorded information has been reviewed and is accurate.    Nat Christen, MD 09/08/14 [phone]

## 2014-09-08 NOTE — ED Notes (Signed)
Pt states he woke about 0400 this morning w/ right side flank pain . Pt states hx of stones.

## 2018-09-13 ENCOUNTER — Other Ambulatory Visit: Payer: Self-pay

## 2018-09-13 ENCOUNTER — Encounter (HOSPITAL_COMMUNITY): Payer: Self-pay | Admitting: *Deleted

## 2018-09-13 ENCOUNTER — Emergency Department (HOSPITAL_COMMUNITY)
Admission: EM | Admit: 2018-09-13 | Discharge: 2018-09-13 | Disposition: A | Discharge status: Home / Self Care | Payer: Medicare Other | Attending: Emergency Medicine | Admitting: Emergency Medicine

## 2018-09-13 DIAGNOSIS — I10 Essential (primary) hypertension: Secondary | ICD-10-CM | POA: Insufficient documentation

## 2018-09-13 DIAGNOSIS — Z79899 Other long term (current) drug therapy: Secondary | ICD-10-CM | POA: Insufficient documentation

## 2018-09-13 DIAGNOSIS — R109 Unspecified abdominal pain: Secondary | ICD-10-CM | POA: Diagnosis not present

## 2018-09-13 DIAGNOSIS — F17210 Nicotine dependence, cigarettes, uncomplicated: Secondary | ICD-10-CM | POA: Insufficient documentation

## 2018-09-13 LAB — URINALYSIS, MICROSCOPIC (REFLEX)
BACTERIA UA: NONE SEEN
SQUAMOUS EPITHELIAL / LPF: NONE SEEN (ref 0–5)
WBC UA: NONE SEEN WBC/hpf (ref 0–5)

## 2018-09-13 LAB — CBC WITH DIFFERENTIAL/PLATELET
Abs Immature Granulocytes: 0.03 10*3/uL (ref 0.00–0.07)
BASOS ABS: 0.1 10*3/uL (ref 0.0–0.1)
Basophils Relative: 1 %
EOS ABS: 0.2 10*3/uL (ref 0.0–0.5)
EOS PCT: 2 %
HCT: 50.1 % (ref 39.0–52.0)
Hemoglobin: 16.4 g/dL (ref 13.0–17.0)
Immature Granulocytes: 0 %
LYMPHS PCT: 24 %
Lymphs Abs: 2.3 10*3/uL (ref 0.7–4.0)
MCH: 30.7 pg (ref 26.0–34.0)
MCHC: 32.7 g/dL (ref 30.0–36.0)
MCV: 93.8 fL (ref 80.0–100.0)
Monocytes Absolute: 0.8 10*3/uL (ref 0.1–1.0)
Monocytes Relative: 8 %
NRBC: 0 % (ref 0.0–0.2)
Neutro Abs: 5.9 10*3/uL (ref 1.7–7.7)
Neutrophils Relative %: 65 %
Platelets: 253 10*3/uL (ref 150–400)
RBC: 5.34 MIL/uL (ref 4.22–5.81)
RDW: 13.3 % (ref 11.5–15.5)
WBC: 9.2 10*3/uL (ref 4.0–10.5)

## 2018-09-13 LAB — URINALYSIS, ROUTINE W REFLEX MICROSCOPIC
BILIRUBIN URINE: NEGATIVE
Glucose, UA: NEGATIVE mg/dL
KETONES UR: NEGATIVE mg/dL
Leukocytes, UA: NEGATIVE
NITRITE: NEGATIVE
PROTEIN: NEGATIVE mg/dL
Specific Gravity, Urine: >1.03 — ABNORMAL HIGH (ref 1.005–1.030)
pH: 6 (ref 5.0–8.0)

## 2018-09-13 LAB — BASIC METABOLIC PANEL
Anion gap: 8 (ref 5–15)
BUN: 14 mg/dL (ref 8–23)
CO2: 24 mmol/L (ref 22–32)
CREATININE: 0.94 mg/dL (ref 0.61–1.24)
Calcium: 8.8 mg/dL — ABNORMAL LOW (ref 8.9–10.3)
Chloride: 109 mmol/L (ref 98–111)
Glucose, Bld: 100 mg/dL — ABNORMAL HIGH (ref 70–99)
Potassium: 3.7 mmol/L (ref 3.5–5.1)
SODIUM: 141 mmol/L (ref 135–145)

## 2018-09-13 MED ORDER — ONDANSETRON HCL 4 MG/2ML IJ SOLN
4.0000 mg | Freq: Once | INTRAMUSCULAR | Status: AC
  Administered 2018-09-13: 4 mg via INTRAVENOUS
  Filled 2018-09-13: qty 2

## 2018-09-13 MED ORDER — HYDROMORPHONE HCL 1 MG/ML IJ SOLN
1.0000 mg | Freq: Once | INTRAMUSCULAR | Status: AC
  Administered 2018-09-13: 1 mg via INTRAVENOUS
  Filled 2018-09-13: qty 1

## 2018-09-13 MED ORDER — ONDANSETRON HCL 4 MG PO TABS: 4.0000 mg | ORAL_TABLET | Freq: Four times a day (QID) | ORAL | 0 refills | Status: DC

## 2018-09-13 MED ORDER — TAMSULOSIN HCL 0.4 MG PO CAPS: 0.4000 mg | ORAL_CAPSULE | Freq: Every day | ORAL | 0 refills | Status: DC

## 2018-09-13 MED ORDER — KETOROLAC TROMETHAMINE 30 MG/ML IJ SOLN
15.0000 mg | Freq: Once | INTRAMUSCULAR | Status: AC
  Administered 2018-09-13: 15 mg via INTRAVENOUS
  Filled 2018-09-13: qty 1

## 2018-09-13 MED ORDER — HYDROCODONE-ACETAMINOPHEN 5-325 MG PO TABS: 1.0000 | ORAL_TABLET | ORAL | 0 refills | Status: DC | PRN

## 2018-09-13 NOTE — ED Provider Notes (Signed)
Please see Dr Rosalene Billings note.  Ua reviewed.  Small amount of hematuria.  No signs of infection.  DC home   Dorie Rank, MD 09/13/18 [phone]

## 2018-09-13 NOTE — ED Notes (Signed)
Pt advised that we need a urine sample, pt states unable to go att will check back in about 30 mins

## 2018-09-13 NOTE — ED Triage Notes (Signed)
Pt c/o right side flank pain that radiates into groin area that woke him up from sleep this am,

## 2018-09-13 NOTE — ED Notes (Signed)
Pt given water 

## 2018-09-13 NOTE — ED Notes (Signed)
Pt given urinal, aware of DO

## 2018-09-13 NOTE — ED Provider Notes (Signed)
[facility] EMERGENCY DEPARTMENT Provider Note   CSN: [id number] Arrival date & time: 09/13/18  7253     History   Chief Complaint Chief Complaint  Patient presents with  . Flank Pain    HPI Robert Meza is a 67 y.o. male.  Patient reports right-sided flank pain.  He states that it woke him up from sleep after it started suddenly this morning.  Initially it was in the back and flank area, but now starting to radiate down into the groin.  He reports that this feels similar to previous kidney stones that he has had.  No vomiting.  He has not noticed hematuria or dysuria.  No fever.     Past Medical History:  Diagnosis Date  . Anxiety   . DDD (degenerative disc disease), lumbar   . Depression   . Diverticulosis   . Edentulous   . Electric injury 05/12/2006   electricuted while doing electrical work   . Head injury 05/12/12   fall after electicution  . Hypercholesteremia   . Hypertension   . Kidney stone     There are no active problems to display for this patient.   Past Surgical History:  Procedure Laterality Date  . CATARACT EXTRACTION W/PHACO  01/15/2012   Procedure: CATARACT EXTRACTION PHACO AND INTRAOCULAR LENS PLACEMENT (IOC);  Surgeon: Tonny Branch, MD;  Location: [facility];  Service: Ophthalmology;  Laterality: Left;  CDE 16.58  . HERNIA REPAIR  age 54   right inguinal  . left ring finger amputation     after electricution   . TONSILLECTOMY  age 20   [facility]        Home Medications    Prior to Admission medications   Medication Sig Start Date End Date Taking? Authorizing Provider  carbamazepine (TEGRETOL XR) 200 MG 12 hr tablet Take 600 mg by mouth at bedtime.    [provider]  oxyCODONE-acetaminophen (PERCOCET) 5-325 MG per tablet Take 2 tablets by mouth every 4 (four) hours as needed. 09/08/14   Nat Christen, MD  promethazine (PHENERGAN) 25 MG tablet Take 1 tablet (25 mg total) by mouth every 6 (six) hours as needed. 09/08/14   Nat Christen, MD    tamsulosin (FLOMAX) 0.4 MG CAPS capsule Take 1 capsule (0.4 mg total) by mouth daily. 09/08/14   Nat Christen, MD    Family History Family History  Problem Relation Age of Onset  . Anesthesia problems Neg Hx   . Hypotension Neg Hx   . Malignant hyperthermia Neg Hx   . Pseudochol deficiency Neg Hx     Social History Social History   Tobacco Use  . Smoking status: Current Every Day Smoker    Packs/day: 1.00    Years: 25.00    Pack years: 25.00    Types: Cigarettes  . Smokeless tobacco: Never Used  Substance Use Topics  . Alcohol use: No  . Drug use: No     Allergies   Amoxicillin   Review of Systems Review of Systems  Genitourinary: Positive for flank pain.  All other systems reviewed and are negative.    Physical Exam Updated Vital Signs BP (!) 186/93 (BP Location: Right Arm)   Pulse 72   Temp 97.9 F (36.6 C) (Oral)   Resp 15   Ht 5\' 8"  (1.727 m)   Wt 75.8 kg   SpO2 97%   BMI 25.39 kg/m   Physical Exam  Constitutional: He is oriented to person, place, and time. He appears  well-developed and well-nourished. He appears distressed.  HENT:  Head: Normocephalic and atraumatic.  Right Ear: Hearing normal.  Left Ear: Hearing normal.  Nose: Nose normal.  Mouth/Throat: Oropharynx is clear and moist and mucous membranes are normal.  Eyes: Pupils are equal, round, and reactive to light. Conjunctivae and EOM are normal.  Neck: Normal range of motion. Neck supple.  Cardiovascular: Regular rhythm, S1 normal and S2 normal. Exam reveals no gallop and no friction rub.  No murmur heard. Pulmonary/Chest: Effort normal and breath sounds normal. No respiratory distress. He exhibits no tenderness.  Abdominal: Soft. Normal appearance and bowel sounds are normal. There is no hepatosplenomegaly. There is no tenderness. There is CVA tenderness (right). There is no rebound, no guarding, no tenderness at McBurney's point and negative Murphy's sign. No hernia.  Musculoskeletal:  Normal range of motion.  Neurological: He is alert and oriented to person, place, and time. He has normal strength. No cranial nerve deficit or sensory deficit. Coordination normal. GCS eye subscore is 4. GCS verbal subscore is 5. GCS motor subscore is 6.  Skin: Skin is warm, dry and intact. No rash noted. No cyanosis.  Psychiatric: He has a normal mood and affect. His speech is normal and behavior is normal. Thought content normal.  Nursing note and vitals reviewed.    ED Treatments / Results  Labs (all labs ordered are listed, but only abnormal results are displayed) Labs Reviewed  CBC WITH DIFFERENTIAL/PLATELET  BASIC METABOLIC PANEL  URINALYSIS, ROUTINE W REFLEX MICROSCOPIC    EKG None  Radiology No results found.  Procedures Procedures (including critical care time)  Medications Ordered in ED Medications  HYDROmorphone (DILAUDID) injection 1 mg (has no administration in time range)  ondansetron (ZOFRAN) injection 4 mg (has no administration in time range)  ketorolac (TORADOL) 30 MG/ML injection 15 mg (has no administration in time range)     Initial Impression / Assessment and Plan / ED Course  I have reviewed the triage vital signs and the nursing notes.  Pertinent labs & imaging results that were available during my care of the patient were reviewed by me and considered in my medical decision making (see chart for details).     Patient reports sudden onset right flank pain that awakened him from sleep this morning.  Patient has a history of recurrent ureterolithiasis and renal colic.  Pain is consistent with previous kidney stones, does not have any significant abdominal tenderness to suggest abdominal pathology.  Based on his presentation and history, does not require imaging.  Treat with analgesia, urinanalysis to rule out concomitant infection.  Final Clinical Impressions(s) / ED Diagnoses   Final diagnoses:  Right flank pain    ED Discharge Orders    None         Orpah Greek, MD 09/13/18 [phone]

## 2018-09-29 ENCOUNTER — Other Ambulatory Visit: Payer: Self-pay

## 2018-09-29 ENCOUNTER — Encounter (HOSPITAL_COMMUNITY): Payer: Self-pay | Admitting: Emergency Medicine

## 2018-09-29 ENCOUNTER — Emergency Department (HOSPITAL_COMMUNITY)
Admission: EM | Admit: 2018-09-29 | Discharge: 2018-09-29 | Disposition: A | Discharge status: Home / Self Care | Payer: Medicare Other | Attending: Emergency Medicine | Admitting: Emergency Medicine

## 2018-09-29 DIAGNOSIS — Z23 Encounter for immunization: Secondary | ICD-10-CM | POA: Diagnosis not present

## 2018-09-29 DIAGNOSIS — W268XXA Contact with other sharp object(s), not elsewhere classified, initial encounter: Secondary | ICD-10-CM | POA: Insufficient documentation

## 2018-09-29 DIAGNOSIS — Z89022 Acquired absence of left finger(s): Secondary | ICD-10-CM | POA: Insufficient documentation

## 2018-09-29 DIAGNOSIS — Z79899 Other long term (current) drug therapy: Secondary | ICD-10-CM | POA: Diagnosis not present

## 2018-09-29 DIAGNOSIS — Y999 Unspecified external cause status: Secondary | ICD-10-CM | POA: Diagnosis not present

## 2018-09-29 DIAGNOSIS — Y929 Unspecified place or not applicable: Secondary | ICD-10-CM | POA: Insufficient documentation

## 2018-09-29 DIAGNOSIS — I10 Essential (primary) hypertension: Secondary | ICD-10-CM | POA: Diagnosis not present

## 2018-09-29 DIAGNOSIS — Y939 Activity, unspecified: Secondary | ICD-10-CM | POA: Diagnosis not present

## 2018-09-29 DIAGNOSIS — S61211A Laceration without foreign body of left index finger without damage to nail, initial encounter: Secondary | ICD-10-CM | POA: Diagnosis not present

## 2018-09-29 DIAGNOSIS — F17210 Nicotine dependence, cigarettes, uncomplicated: Secondary | ICD-10-CM | POA: Insufficient documentation

## 2018-09-29 MED ORDER — POVIDONE-IODINE 10 % EX SOLN
CUTANEOUS | Status: AC
  Administered 2018-09-29: 1
  Filled 2018-09-29: qty 15

## 2018-09-29 MED ORDER — LIDOCAINE HCL (PF) 1 % IJ SOLN
INTRAMUSCULAR | Status: AC
  Administered 2018-09-29: 19:00:00
  Filled 2018-09-29: qty 5

## 2018-09-29 MED ORDER — TETANUS-DIPHTH-ACELL PERTUSSIS 5-2.5-18.5 LF-MCG/0.5 IM SUSP
0.5000 mL | Freq: Once | INTRAMUSCULAR | Status: AC
  Administered 2018-09-29: 0.5 mL via INTRAMUSCULAR
  Filled 2018-09-29: qty 0.5

## 2018-09-29 MED ORDER — SULFAMETHOXAZOLE-TRIMETHOPRIM 800-160 MG PO TABS: 1.0000 | ORAL_TABLET | Freq: Two times a day (BID) | ORAL | 0 refills | Status: AC

## 2018-09-29 NOTE — ED Triage Notes (Signed)
Laceration to Left index finger. Patient reports he cut it with a razor knife this morning. Bleeding controlled in Triage.

## 2018-09-29 NOTE — Discharge Instructions (Signed)
Suture removal in 8 days  °

## 2018-09-29 NOTE — ED Provider Notes (Signed)
[facility] Provider Note   CSN: [id number] Arrival date & time: 09/29/18  1743     History   Chief Complaint Chief Complaint  Patient presents with  . Laceration    HPI KENWOOD ROSIAK is a 67 y.o. male.  The history is provided by the patient. No language interpreter was used.  Hand Pain  This is a new problem. The current episode started yesterday. The problem occurs constantly. The problem has not changed since onset.Nothing aggravates the symptoms. Nothing relieves the symptoms. He has tried nothing for the symptoms. The treatment provided no relief.   Pt cut his finger with a box cutter Pt complains of a cut to left index finger Past Medical History:  Diagnosis Date  . Anxiety   . DDD (degenerative disc disease), lumbar   . Depression   . Diverticulosis   . Edentulous   . Electric injury 05/12/2006   electricuted while doing electrical work   . Head injury 05/12/12   fall after electicution  . Hypercholesteremia   . Hypertension   . Kidney stone     There are no active problems to display for this patient.   Past Surgical History:  Procedure Laterality Date  . CATARACT EXTRACTION W/PHACO  01/15/2012   Procedure: CATARACT EXTRACTION PHACO AND INTRAOCULAR LENS PLACEMENT (IOC);  Surgeon: Tonny Branch, MD;  Location: [facility];  Service: Ophthalmology;  Laterality: Left;  CDE 16.58  . HERNIA REPAIR  age 82   right inguinal  . left ring finger amputation     after electricution   . TONSILLECTOMY  age 7   [facility]        Home Medications    Prior to Admission medications   Medication Sig Start Date End Date Taking? Authorizing Provider  carbamazepine (TEGRETOL XR) 200 MG 12 hr tablet Take 600 mg by mouth at bedtime.    [provider]  HYDROcodone-acetaminophen (NORCO/VICODIN) 5-325 MG tablet Take 1-2 tablets by mouth every 4 (four) hours as needed for moderate pain. 09/13/18   Orpah Greek, MD  ondansetron (ZOFRAN) 4 MG tablet  Take 1 tablet (4 mg total) by mouth every 6 (six) hours. 09/13/18   Orpah Greek, MD  tamsulosin (FLOMAX) 0.4 MG CAPS capsule Take 1 capsule (0.4 mg total) by mouth daily. 09/13/18   Orpah Greek, MD    Family History Family History  Problem Relation Age of Onset  . Cancer Mother   . Cancer Father   . Anesthesia problems Neg Hx   . Hypotension Neg Hx   . Malignant hyperthermia Neg Hx   . Pseudochol deficiency Neg Hx   . Diabetes Neg Hx     Social History Social History   Tobacco Use  . Smoking status: Current Every Day Smoker    Packs/day: 1.00    Years: 25.00    Pack years: 25.00    Types: Cigarettes  . Smokeless tobacco: Never Used  Substance Use Topics  . Alcohol use: No  . Drug use: No     Allergies   Amoxicillin   Review of Systems Review of Systems  Skin: Positive for wound.  All other systems reviewed and are negative.    Physical Exam Updated Vital Signs BP (!) 186/91   Pulse 72   Temp 98 F (36.7 C) (Oral)   Resp 16   Ht 5\' 8"  (1.727 m)   Wt 75.8 kg   SpO2 96%   BMI 25.39 kg/m  Physical Exam  Constitutional: He appears well-developed and well-nourished.  Musculoskeletal:  1.5 cm laceration left index finger gapping nv and ns intact   Neurological: He is alert.  Skin: Skin is warm.  Psychiatric: He has a normal mood and affect.  Nursing note and vitals reviewed.    ED Treatments / Results  Labs (all labs ordered are listed, but only abnormal results are displayed) Labs Reviewed - No data to display  EKG None  Radiology No results found.  Procedures .Marland KitchenLaceration Repair Date/Time: 09/29/2018 9:22 PM Performed by: Fransico Meadow, PA-C Authorized by: Fransico Meadow, PA-C   Consent:    Consent obtained:  Verbal   Consent given by:  Patient   Risks discussed:  Infection, need for additional repair, pain, poor cosmetic result and poor wound healing   Alternatives discussed:  No treatment and delayed  treatment Universal protocol:    Procedure explained and questions answered to patient or proxy's satisfaction: yes     Relevant documents present and verified: yes     Test results available and properly labeled: yes     Imaging studies available: yes     Required blood products, implants, devices, and special equipment available: yes     Site/side marked: yes     Immediately prior to procedure, a time out was called: yes     Patient identity confirmed:  Verbally with patient Anesthesia (see MAR for exact dosages):    Anesthesia method:  Local infiltration   Local anesthetic:  Lidocaine 1% w/o epi Laceration details:    Location:  Finger   Finger location:  L index finger   Length (cm):  2   Depth (mm):  5 Repair type:    Repair type:  Simple Pre-procedure details:    Preparation:  Patient was prepped and draped in usual sterile fashion Exploration:    Wound exploration: wound explored through full range of motion   Treatment:    Area cleansed with:  Betadine   Amount of cleaning:  Standard   Irrigation solution:  Sterile saline   Visualized foreign bodies/material removed: no   Skin repair:    Repair method:  Sutures   Suture size:  5-0   Suture technique:  Simple interrupted   Number of sutures:  3 Approximation:    Approximation:  Loose Post-procedure details:    Patient tolerance of procedure:  Tolerated well, no immediate complications Comments:     Pt has a gapping area where skin is missing.  I placed 3 sutures to approximate edges  I counseled on healing and scarring   (including critical care time)  Medications Ordered in ED Medications  lidocaine (PF) (XYLOCAINE) 1 % injection (has no administration in time range)  povidone-iodine (BETADINE) 10 % external solution (has no administration in time range)  Tdap (BOOSTRIX) injection 0.5 mL (has no administration in time range)     Initial Impression / Assessment and Plan / ED Course  I have reviewed the  triage vital signs and the nursing notes.  Pertinent labs & imaging results that were available during my care of the patient were reviewed by me and considered in my medical decision making (see chart for details).       Final Clinical Impressions(s) / ED Diagnoses   Final diagnoses:  Laceration of left index finger, foreign body presence unspecified, nail damage status unspecified, initial encounter    ED Discharge Orders    None    An After Visit Summary was  printed and given to the patient.   Fransico Meadow, Hershal Coria 09/29/18 2124    Fredia Sorrow, MD 09/30/18 [phone]

## 2018-09-29 NOTE — ED Notes (Signed)
Bulky dressing applied.

## 2018-10-11 DIAGNOSIS — S61211D Laceration without foreign body of left index finger without damage to nail, subsequent encounter: Secondary | ICD-10-CM | POA: Diagnosis not present

## 2018-10-11 DIAGNOSIS — I10 Essential (primary) hypertension: Secondary | ICD-10-CM | POA: Diagnosis not present

## 2018-10-11 DIAGNOSIS — C8400 Mycosis fungoides, unspecified site: Secondary | ICD-10-CM | POA: Diagnosis not present

## 2018-10-11 DIAGNOSIS — F17200 Nicotine dependence, unspecified, uncomplicated: Secondary | ICD-10-CM | POA: Diagnosis not present

## 2018-10-11 DIAGNOSIS — Z23 Encounter for immunization: Secondary | ICD-10-CM | POA: Diagnosis not present

## 2018-10-14 DIAGNOSIS — F17200 Nicotine dependence, unspecified, uncomplicated: Secondary | ICD-10-CM | POA: Diagnosis not present

## 2018-10-14 DIAGNOSIS — I10 Essential (primary) hypertension: Secondary | ICD-10-CM | POA: Diagnosis not present

## 2018-10-14 DIAGNOSIS — S61211D Laceration without foreign body of left index finger without damage to nail, subsequent encounter: Secondary | ICD-10-CM | POA: Diagnosis not present

## 2018-10-14 DIAGNOSIS — Z125 Encounter for screening for malignant neoplasm of prostate: Secondary | ICD-10-CM | POA: Diagnosis not present

## 2019-01-05 DIAGNOSIS — F17200 Nicotine dependence, unspecified, uncomplicated: Secondary | ICD-10-CM | POA: Diagnosis not present

## 2019-01-05 DIAGNOSIS — E785 Hyperlipidemia, unspecified: Secondary | ICD-10-CM | POA: Diagnosis not present

## 2019-01-05 DIAGNOSIS — I10 Essential (primary) hypertension: Secondary | ICD-10-CM | POA: Diagnosis not present

## 2019-01-05 DIAGNOSIS — R531 Weakness: Secondary | ICD-10-CM | POA: Diagnosis not present

## 2019-04-06 DIAGNOSIS — I10 Essential (primary) hypertension: Secondary | ICD-10-CM | POA: Diagnosis not present

## 2019-04-06 DIAGNOSIS — G4700 Insomnia, unspecified: Secondary | ICD-10-CM | POA: Diagnosis not present

## 2019-04-06 DIAGNOSIS — E785 Hyperlipidemia, unspecified: Secondary | ICD-10-CM | POA: Diagnosis not present

## 2019-06-13 DIAGNOSIS — G4700 Insomnia, unspecified: Secondary | ICD-10-CM | POA: Diagnosis not present

## 2019-06-13 DIAGNOSIS — I10 Essential (primary) hypertension: Secondary | ICD-10-CM | POA: Diagnosis not present

## 2019-06-13 DIAGNOSIS — E785 Hyperlipidemia, unspecified: Secondary | ICD-10-CM | POA: Diagnosis not present

## 2020-05-07 ENCOUNTER — Other Ambulatory Visit: Payer: Self-pay

## 2020-05-07 ENCOUNTER — Emergency Department (HOSPITAL_COMMUNITY): Payer: Medicare Other

## 2020-05-07 ENCOUNTER — Emergency Department (HOSPITAL_COMMUNITY)
Admission: EM | Admit: 2020-05-07 | Discharge: 2020-05-07 | Disposition: A | Discharge status: Home / Self Care | Payer: Medicare Other | Attending: Emergency Medicine | Admitting: Emergency Medicine

## 2020-05-07 ENCOUNTER — Encounter (HOSPITAL_COMMUNITY): Payer: Self-pay

## 2020-05-07 DIAGNOSIS — F17210 Nicotine dependence, cigarettes, uncomplicated: Secondary | ICD-10-CM | POA: Insufficient documentation

## 2020-05-07 DIAGNOSIS — N23 Unspecified renal colic: Secondary | ICD-10-CM | POA: Insufficient documentation

## 2020-05-07 DIAGNOSIS — Z79899 Other long term (current) drug therapy: Secondary | ICD-10-CM | POA: Diagnosis not present

## 2020-05-07 DIAGNOSIS — I10 Essential (primary) hypertension: Secondary | ICD-10-CM | POA: Diagnosis not present

## 2020-05-07 DIAGNOSIS — N202 Calculus of kidney with calculus of ureter: Secondary | ICD-10-CM | POA: Diagnosis not present

## 2020-05-07 DIAGNOSIS — N200 Calculus of kidney: Secondary | ICD-10-CM | POA: Insufficient documentation

## 2020-05-07 DIAGNOSIS — R109 Unspecified abdominal pain: Secondary | ICD-10-CM | POA: Diagnosis present

## 2020-05-07 LAB — CBC WITH DIFFERENTIAL/PLATELET
Abs Immature Granulocytes: 0.03 10*3/uL (ref 0.00–0.07)
Basophils Absolute: 0.1 10*3/uL (ref 0.0–0.1)
Basophils Relative: 1 %
Eosinophils Absolute: 0.2 10*3/uL (ref 0.0–0.5)
Eosinophils Relative: 2 %
HCT: 45.6 % (ref 39.0–52.0)
Hemoglobin: 15 g/dL (ref 13.0–17.0)
Immature Granulocytes: 0 %
Lymphocytes Relative: 17 %
Lymphs Abs: 1.7 10*3/uL (ref 0.7–4.0)
MCH: 30.2 pg (ref 26.0–34.0)
MCHC: 32.9 g/dL (ref 30.0–36.0)
MCV: 91.8 fL (ref 80.0–100.0)
Monocytes Absolute: 0.8 10*3/uL (ref 0.1–1.0)
Monocytes Relative: 7 %
Neutro Abs: 7.6 10*3/uL (ref 1.7–7.7)
Neutrophils Relative %: 73 %
Platelets: 232 10*3/uL (ref 150–400)
RBC: 4.97 MIL/uL (ref 4.22–5.81)
RDW: 14.9 % (ref 11.5–15.5)
WBC: 10.3 10*3/uL (ref 4.0–10.5)
nRBC: 0 % (ref 0.0–0.2)

## 2020-05-07 LAB — URINALYSIS, ROUTINE W REFLEX MICROSCOPIC
Bacteria, UA: NONE SEEN
Bilirubin Urine: NEGATIVE
Glucose, UA: NEGATIVE mg/dL
Ketones, ur: NEGATIVE mg/dL
Leukocytes,Ua: NEGATIVE
Nitrite: NEGATIVE
Protein, ur: NEGATIVE mg/dL
Specific Gravity, Urine: 1.017 (ref 1.005–1.030)
pH: 6 (ref 5.0–8.0)

## 2020-05-07 LAB — BASIC METABOLIC PANEL
Anion gap: 12 (ref 5–15)
BUN: 14 mg/dL (ref 8–23)
CO2: 26 mmol/L (ref 22–32)
Calcium: 9.1 mg/dL (ref 8.9–10.3)
Chloride: 103 mmol/L (ref 98–111)
Creatinine, Ser: 0.9 mg/dL (ref 0.61–1.24)
GFR calc Af Amer: >60 mL/min (ref >60)
GFR calc non Af Amer: >60 mL/min (ref >60)
Glucose, Bld: 103 mg/dL — ABNORMAL HIGH (ref 70–99)
Potassium: 3.8 mmol/L (ref 3.5–5.1)
Sodium: 141 mmol/L (ref 135–145)

## 2020-05-07 MED ORDER — OXYCODONE-ACETAMINOPHEN 5-325 MG PO TABS
1.0000 | ORAL_TABLET | Freq: Once | ORAL | Status: AC
  Administered 2020-05-07: 1 via ORAL
  Filled 2020-05-07: qty 1

## 2020-05-07 MED ORDER — IBUPROFEN 600 MG PO TABS: 600.0000 mg | ORAL_TABLET | Freq: Four times a day (QID) | ORAL | 0 refills | Status: DC | PRN

## 2020-05-07 MED ORDER — IBUPROFEN 400 MG PO TABS
600.0000 mg | ORAL_TABLET | Freq: Once | ORAL | Status: AC
  Administered 2020-05-07: 600 mg via ORAL
  Filled 2020-05-07: qty 2

## 2020-05-07 MED ORDER — OXYCODONE-ACETAMINOPHEN 5-325 MG PO TABS: 1.0000 | ORAL_TABLET | Freq: Four times a day (QID) | ORAL | 0 refills | Status: DC | PRN
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
## 2020-05-07 NOTE — Discharge Instructions (Addendum)
You have a 2 mm kidney stone passing on the right side.  It should pass on its own.  Drink lots of water.  I sent pain meds to your pharmacy.

## 2020-05-07 NOTE — ED Triage Notes (Signed)
Pt reports r flank pain that started Saturday but reports it went away and came back last night.  Reports difficulty urinating.

## 2020-08-02 DIAGNOSIS — H25011 Cortical age-related cataract, right eye: Secondary | ICD-10-CM | POA: Diagnosis not present

## 2020-10-15 DIAGNOSIS — Z961 Presence of intraocular lens: Secondary | ICD-10-CM | POA: Diagnosis not present

## 2020-10-15 DIAGNOSIS — H2521 Age-related cataract, morgagnian type, right eye: Secondary | ICD-10-CM | POA: Diagnosis not present
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
## 2020-10-22 DIAGNOSIS — H2521 Age-related cataract, morgagnian type, right eye: Secondary | ICD-10-CM | POA: Diagnosis not present

## 2020-10-23 NOTE — Patient Instructions (Signed)
Robert Meza  10/23/2020     @PREFPERIOPPHARMACY @   Your procedure is scheduled on  10/26/2020  Report to [facility] at  [place].M.  Call this number if you have problems the morning of surgery:  [phone]   Remember:  Do not eat or drink after midnight.                         Take these medicines the morning of surgery with A SIP OF WATER  None    Do not wear jewelry, make-up or nail polish.  Do not wear lotions, powders, or perfumes. Please wear deodorant and brush your teeth.  Do not shave 48 hours prior to surgery.  Men may shave face and neck.  Do not bring valuables to the hospital.  [facility] is not responsible for any belongings or valuables.  Contacts, dentures or bridgework may not be worn into surgery.  Leave your suitcase in the car.  After surgery it may be brought to your room.  For patients admitted to the hospital, discharge time will be determined by your treatment team.  Patients discharged the day of surgery will not be allowed to drive home.   Name and phone number of your driver:   family Special instructions:  DO NOT smoke the morning of your procedure.  Please read over the following fact sheets that you were given. Anesthesia Post-op Instructions and Care and Recovery After Surgery      Cataract Surgery, Care After This sheet gives you information about how to care for yourself after your procedure. Your health care provider may also give you more specific instructions. If you have problems or questions, contact your health care provider. What can I expect after the procedure? After the procedure, it is common to have:  Itching.  Discomfort.  Fluid discharge.  Sensitivity to light and to touch.  Bruising in or around the eye.  Mild blurred vision. Follow these instructions at home: Eye care   Do not touch or rub your eyes.  Protect your eyes as told by your health care provider. You may be told to wear a protective eye  shield or sunglasses.  Do not put a contact lens into the affected eye or eyes until your health care provider approves.  Keep the area around your eye clean and dry: ? Avoid swimming. ? Do not allow water to hit you directly in the face while showering. ? Keep soap and shampoo out of your eyes.  Check your eye every day for signs of infection. Watch for: ? Redness, swelling, or pain. ? Fluid, blood, or pus. ? Warmth. ? A bad smell. ? Vision that is getting worse. ? Sensitivity that is getting worse. Activity  Do not drive for 24 hours if you were given a sedative during your procedure.  Avoid strenuous activities, such as playing contact sports, for as long as told by your health care provider.  Do not drive or use heavy machinery until your health care provider approves.  Do not bend or lift heavy objects. Bending increases pressure in the eye. You can walk, climb stairs, and do light household chores.  Ask your health care provider when you can return to work. If you work in a dusty environment, you may be advised to wear protective eyewear for a period of time. General instructions  Take or apply over-the-counter and prescription medicines only as told by your  health care provider. This includes eye drops.  Keep all follow-up visits as told by your health care provider. This is important. Contact a health care provider if:  You have increased bruising around your eye.  You have pain that is not helped with medicine.  You have a fever.  You have redness, swelling, or pain in your eye.  You have fluid, blood, or pus coming from your incision.  Your vision gets worse.  Your sensitivity to light gets worse. Get help right away if:  You have sudden loss of vision.  You see flashes of light or spots (floaters).  You have severe eye pain.  You develop nausea or vomiting. Summary  After your procedure, it is common to have itching, discomfort, bruising, fluid  discharge, or sensitivity to light.  Follow instructions from your health care provider about caring for your eye after the procedure.  Do not rub your eye after the procedure. You may need to wear eye protection or sunglasses. Do not wear contact lenses. Keep the area around your eye clean and dry.  Avoid activities that require a lot of effort. These include playing sports and lifting heavy objects.  Contact a health care provider if you have increased bruising, pain that does not go away, or a fever. Get help right away if you suddenly lose your vision, see flashes of light or spots, or have severe pain in the eye. This information is not intended to replace advice given to you by your health care provider. Make sure you discuss any questions you have with your health care provider. Document Revised: 08/23/2019 Document Reviewed: 04/26/2018 Elsevier Patient Education  [employer].

## 2020-10-24 ENCOUNTER — Encounter (HOSPITAL_COMMUNITY): Payer: Self-pay

## 2020-10-24 ENCOUNTER — Other Ambulatory Visit (HOSPITAL_COMMUNITY)
Admission: RE | Admit: 2020-10-24 | Discharge: 2020-10-24 | Disposition: A | Discharge status: Home / Self Care | Payer: Medicare Other | Source: Ambulatory Visit | Attending: Ophthalmology | Admitting: Ophthalmology

## 2020-10-24 ENCOUNTER — Other Ambulatory Visit: Payer: Self-pay

## 2020-10-24 ENCOUNTER — Encounter (HOSPITAL_COMMUNITY)
Admission: RE | Admit: 2020-10-24 | Discharge: 2020-10-24 | Disposition: A | Discharge status: Home / Self Care | Payer: Medicare Other | Source: Ambulatory Visit | Attending: Ophthalmology | Admitting: Ophthalmology

## 2020-10-24 DIAGNOSIS — Z20822 Contact with and (suspected) exposure to covid-19: Secondary | ICD-10-CM | POA: Insufficient documentation

## 2020-10-24 DIAGNOSIS — Z01812 Encounter for preprocedural laboratory examination: Secondary | ICD-10-CM | POA: Diagnosis not present

## 2020-10-24 HISTORY — DX: Personal history of urinary calculi: Z87.442

## 2020-10-25 LAB — SARS CORONAVIRUS 2 (TAT 6-24 HRS): SARS Coronavirus 2: NEGATIVE

## 2020-10-26 ENCOUNTER — Encounter (HOSPITAL_COMMUNITY)
Admission: RE | Disposition: A | Discharge status: Home / Self Care | Payer: Self-pay | Source: Home / Self Care | Attending: Ophthalmology

## 2020-10-26 ENCOUNTER — Other Ambulatory Visit: Payer: Self-pay

## 2020-10-26 ENCOUNTER — Ambulatory Visit (HOSPITAL_COMMUNITY): Payer: Medicare Other | Admitting: Anesthesiology

## 2020-10-26 ENCOUNTER — Ambulatory Visit (HOSPITAL_COMMUNITY)
Admission: RE | Admit: 2020-10-26 | Discharge: 2020-10-26 | Disposition: A | Discharge status: Home / Self Care | Payer: Medicare Other | Attending: Ophthalmology | Admitting: Ophthalmology

## 2020-10-26 ENCOUNTER — Encounter (HOSPITAL_COMMUNITY): Payer: Self-pay | Admitting: Ophthalmology

## 2020-10-26 DIAGNOSIS — H0100A Unspecified blepharitis right eye, upper and lower eyelids: Secondary | ICD-10-CM | POA: Diagnosis not present

## 2020-10-26 DIAGNOSIS — F17200 Nicotine dependence, unspecified, uncomplicated: Secondary | ICD-10-CM | POA: Insufficient documentation

## 2020-10-26 DIAGNOSIS — H2521 Age-related cataract, morgagnian type, right eye: Secondary | ICD-10-CM | POA: Diagnosis not present

## 2020-10-26 DIAGNOSIS — H0100B Unspecified blepharitis left eye, upper and lower eyelids: Secondary | ICD-10-CM | POA: Insufficient documentation

## 2020-10-26 DIAGNOSIS — Z961 Presence of intraocular lens: Secondary | ICD-10-CM | POA: Diagnosis not present

## 2020-10-26 DIAGNOSIS — F418 Other specified anxiety disorders: Secondary | ICD-10-CM | POA: Diagnosis not present

## 2020-10-26 DIAGNOSIS — Z79899 Other long term (current) drug therapy: Secondary | ICD-10-CM | POA: Diagnosis not present

## 2020-10-26 DIAGNOSIS — F17210 Nicotine dependence, cigarettes, uncomplicated: Secondary | ICD-10-CM | POA: Diagnosis not present

## 2020-10-26 DIAGNOSIS — I10 Essential (primary) hypertension: Secondary | ICD-10-CM | POA: Diagnosis not present

## 2020-10-26 HISTORY — PX: CATARACT EXTRACTION W/PHACO: SHX586

## 2020-10-26 SURGERY — PHACOEMULSIFICATION, CATARACT, WITH IOL INSERTION
Anesthesia: Monitor Anesthesia Care | Site: Eye | Laterality: Right

## 2020-10-26 MED ORDER — PHENYLEPHRINE-KETOROLAC 1-0.3 % IO SOLN
INTRAOCULAR | Status: DC | PRN
  Administered 2020-10-26: 11:00:00 500 mL via OPHTHALMIC

## 2020-10-26 MED ORDER — CYCLOPENTOLATE-PHENYLEPHRINE 0.2-1 % OP SOLN
1.0000 [drp] | OPHTHALMIC | Status: AC | PRN
  Administered 2020-10-26 (×3): 1 [drp] via OPHTHALMIC

## 2020-10-26 MED ORDER — PROVISC 10 MG/ML IO SOLN
INTRAOCULAR | Status: DC | PRN
  Administered 2020-10-26: 0.85 mL via INTRAOCULAR

## 2020-10-26 MED ORDER — MIDAZOLAM HCL 2 MG/2ML IJ SOLN
INTRAMUSCULAR | Status: AC
  Filled 2020-10-26: qty 2

## 2020-10-26 MED ORDER — NEOMYCIN-POLYMYXIN-DEXAMETH 3.5-10000-0.1 OP SUSP
OPHTHALMIC | Status: DC | PRN
  Administered 2020-10-26: 1 [drp] via OPHTHALMIC

## 2020-10-26 MED ORDER — MIDAZOLAM HCL 2 MG/2ML IJ SOLN
INTRAMUSCULAR | Status: DC | PRN
  Administered 2020-10-26: 2 mg via INTRAVENOUS

## 2020-10-26 MED ORDER — POVIDONE-IODINE 5 % OP SOLN
OPHTHALMIC | Status: DC | PRN
  Administered 2020-10-26: 1 via OPHTHALMIC

## 2020-10-26 MED ORDER — PHENYLEPHRINE-KETOROLAC 1-0.3 % IO SOLN
INTRAOCULAR | Status: AC
  Filled 2020-10-26: qty 4

## 2020-10-26 MED ORDER — PHENYLEPHRINE HCL 2.5 % OP SOLN
1.0000 [drp] | OPHTHALMIC | Status: AC | PRN
  Administered 2020-10-26 (×3): 1 [drp] via OPHTHALMIC

## 2020-10-26 MED ORDER — SODIUM HYALURONATE 23 MG/ML IO SOLN
INTRAOCULAR | Status: DC | PRN
  Administered 2020-10-26: 0.6 mL via INTRAOCULAR

## 2020-10-26 MED ORDER — STERILE WATER FOR IRRIGATION IR SOLN
Status: DC | PRN
  Administered 2020-10-26: 250 mL

## 2020-10-26 MED ORDER — LIDOCAINE HCL 3.5 % OP GEL
1.0000 "application " | Freq: Once | OPHTHALMIC | Status: AC
  Administered 2020-10-26: 1 via OPHTHALMIC

## 2020-10-26 MED ORDER — EPINEPHRINE PF 1 MG/ML IJ SOLN
INTRAMUSCULAR | Status: AC
  Filled 2020-10-26: qty 2

## 2020-10-26 MED ORDER — TRYPAN BLUE 0.06 % OP SOLN
OPHTHALMIC | Status: DC | PRN
  Administered 2020-10-26: 0.5 mL via INTRAOCULAR

## 2020-10-26 MED ORDER — SODIUM CHLORIDE 0.9% FLUSH
INTRAVENOUS | Status: DC | PRN
  Administered 2020-10-26: 3 mL via INTRAVENOUS

## 2020-10-26 MED ORDER — TETRACAINE HCL 0.5 % OP SOLN
1.0000 [drp] | OPHTHALMIC | Status: AC | PRN
  Administered 2020-10-26 (×3): 1 [drp] via OPHTHALMIC

## 2020-10-26 MED ORDER — BSS IO SOLN
INTRAOCULAR | Status: DC | PRN
  Administered 2020-10-26: 15 mL via INTRAOCULAR

## 2020-10-26 MED ORDER — LIDOCAINE HCL (PF) 1 % IJ SOLN
INTRAOCULAR | Status: DC | PRN
  Administered 2020-10-26: 11:00:00 1 mL via OPHTHALMIC

## 2020-10-26 SURGICAL SUPPLY — 16 items
CLOTH BEACON ORANGE TIMEOUT ST (SAFETY) ×3 IMPLANT
DEVICE MILOOP (MISCELLANEOUS) IMPLANT
EYE SHIELD UNIVERSAL CLEAR (GAUZE/BANDAGES/DRESSINGS) ×3 IMPLANT
GLOVE BIOGEL PI IND STRL 7.0 (GLOVE) ×3 IMPLANT
GLOVE BIOGEL PI INDICATOR 7.0 (GLOVE) ×6
MILOOP DEVICE (MISCELLANEOUS)
NEEDLE HYPO 18GX1.5 BLUNT FILL (NEEDLE) ×3 IMPLANT
PAD ARMBOARD 7.5X6 YLW CONV (MISCELLANEOUS) ×3 IMPLANT
RING MALYGIN (MISCELLANEOUS) IMPLANT
RING MALYGIN 7.0 (MISCELLANEOUS) IMPLANT
SYR TB 1ML LL NO SAFETY (SYRINGE) ×3 IMPLANT
TAPE SURG TRANSPORE 1 IN (GAUZE/BANDAGES/DRESSINGS) ×1 IMPLANT
TAPE SURGICAL TRANSPORE 1 IN (GAUZE/BANDAGES/DRESSINGS) ×3
TECNIS 1 PIECE INTRAOCULAR LENS (Intraocular Lens) ×3 IMPLANT
VISCOELASTIC ADDITIONAL (OPHTHALMIC RELATED) IMPLANT
WATER STERILE IRR 250ML POUR (IV SOLUTION) ×3 IMPLANT

## 2020-10-26 NOTE — Interval H&P Note (Signed)
History and Physical Interval Note:  10/26/2020 10:44 AM  Robert Meza  has presented today for surgery, with the diagnosis of nuclear cataract right eye.  The various methods of treatment have been discussed with the patient and family. After consideration of risks, benefits and other options for treatment, the patient has consented to  Procedure(s) with comments: CATARACT EXTRACTION PHACO AND INTRAOCULAR LENS PLACEMENT RIGHT EYE (Right) - right as a surgical intervention.  The patient's history has been reviewed, patient examined, no change in status, stable for surgery.  I have reviewed the patient's chart and labs.  Questions were answered to the patient's satisfaction.     Baruch Goldmann

## 2020-10-26 NOTE — Anesthesia Postprocedure Evaluation (Signed)
Anesthesia Post Note  Patient: Robert Meza  Procedure(s) Performed: CATARACT EXTRACTION PHACO AND INTRAOCULAR LENS PLACEMENT RIGHT EYE (Right Eye)  Patient location during evaluation: Short Stay Anesthesia Type: MAC Level of consciousness: patient cooperative and awake and alert Pain management: satisfactory to patient Vital Signs Assessment: post-procedure vital signs reviewed and stable Respiratory status: spontaneous breathing Cardiovascular status: stable Postop Assessment: no apparent nausea or vomiting Anesthetic complications: no   No complications documented.   Last Vitals:  Vitals:   10/26/20 1006 10/26/20 1112  BP: (!) (P) 162/87 (!) 137/96  Pulse: (!) (P) 57 60  Resp: (!) (P) 21 16  Temp: (P) 36.5 C 36.6 C  SpO2:  98%    Last Pain:  Vitals:   10/26/20 1112  TempSrc: Oral  PainSc: 7                  ,

## 2020-10-26 NOTE — Transfer of Care (Signed)
Immediate Anesthesia Transfer of Care Note  Patient: Robert Meza  Procedure(s) Performed: CATARACT EXTRACTION PHACO AND INTRAOCULAR LENS PLACEMENT RIGHT EYE (Right Eye)  Patient Location: Short Stay  Anesthesia Type:MAC  Level of Consciousness: awake, alert  and patient cooperative  Airway & Oxygen Therapy: Patient Spontanous Breathing  Post-op Assessment: Report given to RN and Post -op Vital signs reviewed and stable  Post vital signs: Reviewed and stable  Last Vitals:  Vitals Value Taken Time  BP    Temp    Pulse    Resp    SpO2    SEE PACU FLOW SHEEET  Last Pain:  Vitals:   10/26/20 1006  TempSrc: (P) Oral         Complications: No complications documented.

## 2020-10-26 NOTE — Op Note (Signed)
Date of procedure: 10/26/20  Pre-operative diagnosis: Mature Visually significant age-related cataract, Right Eye (H25.21)  Post-operative diagnosis: Mature Visually significant age-related cataract, Right Eye  Procedure: Complex Removal of cataract via phacoemulsification and insertion of intra-ocular lens AMO DCB00 +19.0D into the capsular bag of the Right Eye  Attending surgeon: Gerda Diss. , MD, MA  Anesthesia: MAC, Topical Akten  Complications: None  Estimated Blood Loss: <44m (minimal)  Specimens: None  Implants: As above  Indications:  Mature Visually significant age-related cataract, Right Eye  Procedure:  The patient was seen and identified in the pre-operative area. The operative eye was identified and dilated.  The operative eye was marked.  Topical anesthesia was administered to the operative eye.     The patient was then to the operative suite and placed in the supine position.  A timeout was performed confirming the patient, procedure to be performed, and all other relevant information.   The patient's face was prepped and draped in the usual fashion for intra-ocular surgery.  A lid speculum was placed into the operative eye and the surgical microscope moved into place and focused.  A lack of red reflex due to a mature cataract was confirmed.  A superotemporal paracentesis was created using a 20 gauge paracentesis blade.  Vision blue was injected into the anterior chamber.  Shugarcaine was injected into the anterior chamber.  Viscoelastic was injected into the anterior chamber.  A temporal clear-corneal main wound incision was created using a 2.478mmicrokeratome.  A continuous curvilinear capsulorrhexis was initiated using an irrigating cystitome and completed using capsulorrhexis forceps.  Hydrodissection and hydrodeliniation were performed.  Viscoelastic was injected into the anterior chamber.  A phacoemulsification handpiece and a chopper as a second instrument were used  to remove the nucleus and epinucleus. The irrigation/aspiration handpiece was used to remove any remaining cortical material.   The capsular bag was reinflated with viscoelastic, checked, and found to be intact. The intraocular lens was inserted into the capsular bag and dialed into place using a kuglen hook.  The irrigation/aspiration handpiece was used to remove any remaining viscoelastic.  The clear corneal wound and paracentesis wounds were then hydrated and checked with Weck-Cels to be watertight.  The lid-speculum and drape was removed, and the patient's face was cleaned with a wet and dry 4x4.  Maxitrol was instilled in the eye before a clear shield was taped over the eye. The patient was taken to the post-operative care unit in good condition, having tolerated the procedure well.  Post-Op Instructions: [facility] and will receive all other orders and instructions.

## 2020-10-26 NOTE — Anesthesia Preprocedure Evaluation (Signed)
Anesthesia Evaluation  Patient identified by MRN, date of birth, ID band Patient awake    Reviewed: Allergy & Precautions, H&P , NPO status , Patient's Chart, lab work & pertinent test results, reviewed documented beta blocker date and time   Airway Mallampati: II  TM Distance: >3 FB Neck ROM: full    Dental no notable dental hx. (+) Edentulous Upper, Edentulous Lower   Pulmonary neg pulmonary ROS, Current Smoker and Patient abstained from smoking.,    Pulmonary exam normal breath sounds clear to auscultation       Cardiovascular Exercise Tolerance: Good hypertension, negative cardio ROS   Rhythm:regular Rate:Normal     Neuro/Psych PSYCHIATRIC DISORDERS Anxiety Depression negative neurological ROS     GI/Hepatic negative GI ROS, Neg liver ROS,   Endo/Other  negative endocrine ROS  Renal/GU negative Renal ROS  negative genitourinary   Musculoskeletal   Abdominal   Peds  Hematology negative hematology ROS (+)   Anesthesia Other Findings   Reproductive/Obstetrics negative OB ROS                             Anesthesia Physical Anesthesia Plan  ASA: III  Anesthesia Plan: MAC   Post-op Pain Management:    Induction:   PONV Risk Score and Plan:   Airway Management Planned:   Additional Equipment:   Intra-op Plan:   Post-operative Plan:   Informed Consent: I have reviewed the patients History and Physical, chart, labs and discussed the procedure including the risks, benefits and alternatives for the proposed anesthesia with the patient or authorized representative who has indicated his/her understanding and acceptance.     Dental Advisory Given  Plan Discussed with: CRNA  Anesthesia Plan Comments:         Anesthesia Quick Evaluation

## 2020-10-26 NOTE — Anesthesia Procedure Notes (Signed)
Procedure Name: MAC Date/Time: 10/26/2020 10:48 AM Performed by: Vista Deck, CRNA Pre-anesthesia Checklist: Patient identified, Emergency Drugs available, Suction available, Timeout performed and Patient being monitored Patient Re-evaluated:Patient Re-evaluated prior to induction Oxygen Delivery Method: Nasal Cannula

## 2020-10-26 NOTE — Discharge Instructions (Addendum)
Please discharge patient when stable, will follow up today with Dr. Wrzosek at the [facility] [place] office immediately following discharge.  Leave shield in place until visit.  All paperwork with discharge instructions will be given at the office.  [facility] Address:  [address]  [place], [place] [address]   PATIENT INSTRUCTIONS POST-ANESTHESIA  IMMEDIATELY FOLLOWING SURGERY:  Do not drive or operate machinery for the first twenty four hours after surgery.  Do not make any important decisions for twenty four hours after surgery or while taking narcotic pain medications or sedatives.  If you develop intractable nausea and vomiting or a severe headache please notify your doctor immediately.  FOLLOW-UP:  Please make an appointment with your surgeon as instructed. You do not need to follow up with anesthesia unless specifically instructed to do so.  WOUND CARE INSTRUCTIONS (if applicable):  Keep a dry clean dressing on the anesthesia/puncture wound site if there is drainage.  Once the wound has quit draining you may leave it open to air.  Generally you should leave the bandage intact for twenty four hours unless there is drainage.  If the epidural site drains for more than 36-48 hours please call the anesthesia department.  QUESTIONS?:  Please feel free to call your physician or the hospital operator if you have any questions, and they will be happy to assist you.       

## 2020-10-29 ENCOUNTER — Encounter (HOSPITAL_COMMUNITY): Payer: Self-pay | Admitting: Ophthalmology

## 2021-02-11 DIAGNOSIS — H26491 Other secondary cataract, right eye: Secondary | ICD-10-CM | POA: Diagnosis not present

## 2021-03-15 ENCOUNTER — Emergency Department (HOSPITAL_COMMUNITY): Payer: Medicare HMO

## 2021-03-15 ENCOUNTER — Other Ambulatory Visit: Payer: Self-pay

## 2021-03-15 ENCOUNTER — Encounter (HOSPITAL_COMMUNITY): Payer: Self-pay | Admitting: *Deleted

## 2021-03-15 ENCOUNTER — Emergency Department (HOSPITAL_COMMUNITY)
Admission: EM | Admit: 2021-03-15 | Discharge: 2021-03-15 | Disposition: A | Discharge status: Home / Self Care | Payer: Medicare HMO | Attending: Emergency Medicine | Admitting: Emergency Medicine

## 2021-03-15 DIAGNOSIS — R059 Cough, unspecified: Secondary | ICD-10-CM | POA: Diagnosis not present

## 2021-03-15 DIAGNOSIS — F17210 Nicotine dependence, cigarettes, uncomplicated: Secondary | ICD-10-CM | POA: Diagnosis not present

## 2021-03-15 DIAGNOSIS — I10 Essential (primary) hypertension: Secondary | ICD-10-CM | POA: Insufficient documentation

## 2021-03-15 DIAGNOSIS — R202 Paresthesia of skin: Secondary | ICD-10-CM | POA: Diagnosis not present

## 2021-03-15 DIAGNOSIS — R200 Anesthesia of skin: Secondary | ICD-10-CM | POA: Insufficient documentation

## 2021-03-15 DIAGNOSIS — Z79899 Other long term (current) drug therapy: Secondary | ICD-10-CM | POA: Insufficient documentation

## 2021-03-15 DIAGNOSIS — R509 Fever, unspecified: Secondary | ICD-10-CM | POA: Diagnosis not present

## 2021-03-15 DIAGNOSIS — I6782 Cerebral ischemia: Secondary | ICD-10-CM | POA: Diagnosis not present

## 2021-03-15 LAB — CBC WITH DIFFERENTIAL/PLATELET
Abs Immature Granulocytes: 0.01 10*3/uL (ref 0.00–0.07)
Basophils Absolute: 0 10*3/uL (ref 0.0–0.1)
Basophils Relative: 0 %
Eosinophils Absolute: 0.1 10*3/uL (ref 0.0–0.5)
Eosinophils Relative: 1 %
HCT: 48.3 % (ref 39.0–52.0)
Hemoglobin: 15.9 g/dL (ref 13.0–17.0)
Immature Granulocytes: 0 %
Lymphocytes Relative: 26 %
Lymphs Abs: 1.3 10*3/uL (ref 0.7–4.0)
MCH: 30.2 pg (ref 26.0–34.0)
MCHC: 32.9 g/dL (ref 30.0–36.0)
MCV: 91.7 fL (ref 80.0–100.0)
Monocytes Absolute: 0.5 10*3/uL (ref 0.1–1.0)
Monocytes Relative: 10 %
Neutro Abs: 3.1 10*3/uL (ref 1.7–7.7)
Neutrophils Relative %: 63 %
Platelets: 186 10*3/uL (ref 150–400)
RBC: 5.27 MIL/uL (ref 4.22–5.81)
RDW: 14.6 % (ref 11.5–15.5)
WBC: 4.9 10*3/uL (ref 4.0–10.5)
nRBC: 0 % (ref 0.0–0.2)

## 2021-03-15 LAB — COMPREHENSIVE METABOLIC PANEL
ALT: 30 U/L (ref 0–44)
AST: 31 U/L (ref 15–41)
Albumin: 3.7 g/dL (ref 3.5–5.0)
Alkaline Phosphatase: 69 U/L (ref 38–126)
Anion gap: 6 (ref 5–15)
BUN: 13 mg/dL (ref 8–23)
CO2: 28 mmol/L (ref 22–32)
Calcium: 8.4 mg/dL — ABNORMAL LOW (ref 8.9–10.3)
Chloride: 102 mmol/L (ref 98–111)
Creatinine, Ser: 0.87 mg/dL (ref 0.61–1.24)
GFR, Estimated: >60 mL/min (ref >60)
Glucose, Bld: 90 mg/dL (ref 70–99)
Potassium: 3.9 mmol/L (ref 3.5–5.1)
Sodium: 136 mmol/L (ref 135–145)
Total Bilirubin: 0.4 mg/dL (ref 0.3–1.2)
Total Protein: 6.6 g/dL (ref 6.5–8.1)

## 2021-03-15 MED ORDER — LISINOPRIL 20 MG PO TABS: 20.0000 mg | ORAL_TABLET | Freq: Every day | ORAL | 0 refills | Status: DC

## 2021-03-15 NOTE — ED Notes (Signed)
Did EKG on pt

## 2021-03-15 NOTE — Discharge Instructions (Addendum)
We sent a prescription for lisinopril to your pharmacy to take for high blood pressure.  This may improve some of your symptoms.  Follow-up with a medical doctor in a week or so for recheck of your blood pressure and further treatment as needed.  Try to stay on a low-sodium diet to help keep your blood pressure lower.

## 2021-03-15 NOTE — ED Triage Notes (Signed)
Pt c/o numbness to right lower lip that started Wednesday. Pt also c/o tightness in bilateral calves, intermittent fevers.

## 2021-03-15 NOTE — ED Provider Notes (Signed)
[facility] EMERGENCY DEPARTMENT Provider Note   CSN: [id number] Arrival date & time: 03/15/21  0102     History No chief complaint on file.   Robert Meza is a 70 y.o. male.  HPI He presents for evaluation of tingling in his right lower lip, on and off for several days.  He also has a tight feeling in both lower legs and generalized achiness.  He was bit by deer tick about 1 month ago and wonders if he has Lyme's disease.  He denies fever, chills, rash, focal weakness, dizziness or trouble walking.  He ran out of his lisinopril, because his doctor retired, several months ago.  He denies headache, neck pain or back pain.  He has not had any nausea, vomiting, change in bowel or urine habits.  There are no other known modifying factors.    Past Medical History:  Diagnosis Date  . Anxiety   . DDD (degenerative disc disease), lumbar   . Depression   . Diverticulosis   . Edentulous   . Electric injury 05/12/2006   electricuted while doing electrical work   . Head injury 05/12/12   fall after electicution  . History of kidney stones   . Hypercholesteremia   . Hypertension     There are no problems to display for this patient.   Past Surgical History:  Procedure Laterality Date  . CATARACT EXTRACTION W/PHACO  01/15/2012   Procedure: CATARACT EXTRACTION PHACO AND INTRAOCULAR LENS PLACEMENT (IOC);  Surgeon: Tonny Branch, MD;  Location: [facility];  Service: Ophthalmology;  Laterality: Left;  CDE 16.58  . CATARACT EXTRACTION W/PHACO Right 10/26/2020   Procedure: CATARACT EXTRACTION PHACO AND INTRAOCULAR LENS PLACEMENT RIGHT EYE;  Surgeon: Baruch Goldmann, MD;  Location: [facility];  Service: Ophthalmology;  Laterality: Right;  CDE  61.64  . HERNIA REPAIR  age 37   right inguinal  . left ring finger amputation     after electricution   . TONSILLECTOMY  age 62   [facility]       Family History  Problem Relation Age of Onset  . Cancer Mother   . Cancer Father   . Anesthesia problems Neg Hx   .  Hypotension Neg Hx   . Malignant hyperthermia Neg Hx   . Pseudochol deficiency Neg Hx   . Diabetes Neg Hx     Social History   Tobacco Use  . Smoking status: Current Every Day Smoker    Packs/day: 1.00    Years: 25.00    Pack years: 25.00    Types: Cigarettes  . Smokeless tobacco: Never Used  Vaping Use  . Vaping Use: Never used  Substance Use Topics  . Alcohol use: No  . Drug use: No    Home Medications Prior to Admission medications   Medication Sig Start Date End Date Taking? Authorizing Provider  lisinopril (ZESTRIL) 20 MG tablet Take 1 tablet (20 mg total) by mouth daily. 03/15/21  Yes Daleen Bo, MD    Allergies    Amoxicillin  Review of Systems   Review of Systems  All other systems reviewed and are negative.   Physical Exam Updated Vital Signs BP (!) 129/101   Pulse (!) 54   Temp 98.5 F (36.9 C) (Oral)   Resp (!) 24   Ht 5\' 9"  (1.753 m)   Wt 72.6 kg   SpO2 96%   BMI 23.63 kg/m   Physical Exam Vitals and nursing note reviewed.  Constitutional:  General: He is not in acute distress.    Appearance: He is well-developed. He is not ill-appearing, toxic-appearing or diaphoretic.  HENT:     Head: Normocephalic and atraumatic.     Right Ear: External ear normal.     Left Ear: External ear normal.  Eyes:     Conjunctiva/sclera: Conjunctivae normal.     Pupils: Pupils are equal, round, and reactive to light.  Neck:     Trachea: Phonation normal.  Cardiovascular:     Rate and Rhythm: Normal rate and regular rhythm.     Heart sounds: Normal heart sounds.  Pulmonary:     Effort: Pulmonary effort is normal.     Breath sounds: Normal breath sounds. No stridor.  Abdominal:     Palpations: Abdomen is soft.     Tenderness: There is no abdominal tenderness.  Musculoskeletal:        General: Normal range of motion.     Cervical back: Normal range of motion and neck supple.  Skin:    General: Skin is warm and dry.  Neurological:     Mental  Status: He is alert and oriented to person, place, and time.     Cranial Nerves: No cranial nerve deficit.     Sensory: No sensory deficit.     Motor: No abnormal muscle tone.     Coordination: Coordination normal.     Comments: No dysarthria, aphasia or nystagmus.  No ataxia.  Normal strength, arms and legs bilaterally  Psychiatric:        Mood and Affect: Mood normal.        Behavior: Behavior normal.        Thought Content: Thought content normal.        Judgment: Judgment normal.     ED Results / Procedures / Treatments   Labs (all labs ordered are listed, but only abnormal results are displayed) Labs Reviewed  COMPREHENSIVE METABOLIC PANEL - Abnormal; Notable for the following components:      Result Value   Calcium 8.4 (*)    All other components within normal limits  CBC WITH DIFFERENTIAL/PLATELET  LYME DISEASE DNA BY PCR(BORRELIA BURG)  LYME DISEASE, WESTERN BLOT    EKG EKG Interpretation  Date/Time:  Friday Mar 15 2021 10:05:17 EDT Ventricular Rate:  60 PR Interval:  167 QRS Duration: 91 QT Interval:  469 QTC Calculation: 469 R Axis:   79 Text Interpretation: Sinus rhythm since last tracing no significant change Confirmed by Daleen Bo [phone]) on 03/15/2021 10:08:27 AM   Radiology DG Chest 2 View  Result Date: 03/15/2021 CLINICAL DATA:  Cough EXAM: CHEST - 2 VIEW COMPARISON:  January 19, 2014 FINDINGS: Lungs are mildly hyperexpanded. No edema or airspace opacity. There is mild generalized interstitial thickening. Heart size and pulmonary vascular normal. No adenopathy. There are foci of degenerative change in the thoracic spine. IMPRESSION: A small hyperexpanded. Areas of relative interstitial thickening likely represents a degree of underlying chronic bronchitis. No edema or airspace opacity. Heart size normal. Electronically Signed   By: Lowella Grip III M.D.   On: 03/15/2021 10:16   CT Head Wo Contrast  Result Date: 03/15/2021 CLINICAL DATA:  Numbness to  the right lower lip beginning 2 days ago. Tightness in the calves. Intermittent fevers. History of hypertension. EXAM: CT HEAD WITHOUT CONTRAST TECHNIQUE: Contiguous axial images were obtained from the base of the skull through the vertex without intravenous contrast. COMPARISON:  None. FINDINGS: Brain: No evidence of acute infarction, hemorrhage, hydrocephalus,  extra-axial collection or mass lesion/mass effect. Mild periventricular and subcortical white matter hypoattenuation is noted consistent with chronic microvascular ischemic change. Vascular: No hyperdense vessel or unexpected calcification. Skull: Normal. Negative for fracture or focal lesion. Sinuses/Orbits: Globes and orbits are unremarkable. Visualized sinuses are clear. Other: None. IMPRESSION: 1. No acute intracranial abnormalities. 2. Mild chronic microvascular ischemic change. Electronically Signed   By: Lajean Manes M.D.   On: 03/15/2021 09:45    Procedures Procedures   Medications Ordered in ED Medications - No data to display  ED Course  I have reviewed the triage vital signs and the nursing notes.  Pertinent labs & imaging results that were available during my care of the patient were reviewed by me and considered in my medical decision making (see chart for details).    MDM Rules/Calculators/A&P                           Patient Vitals for the past 24 hrs:  BP Temp Temp src Pulse Resp SpO2 Height Weight  03/15/21 1130 (!) 129/101 -- -- (!) 54 (!) 24 96 % -- --  03/15/21 1030 (!) 162/90 -- -- (!) 55 (!) 22 97 % -- --  03/15/21 1012 (!) 164/94 -- -- 62 18 97 % -- --  03/15/21 1000 (!) 164/94 -- -- 62 -- 97 % -- --  03/15/21 0823 (!) 180/94 98.5 F (36.9 C) Oral 71 16 96 % -- --  03/15/21 0819 -- -- -- -- -- -- 5\' 9"  (1.753 m) 72.6 kg    1:08 PM Reevaluation with update and discussion. After initial assessment and treatment, an updated evaluation reveals he denies numbness at this time.  He denies headache, neck pain  or back pain.  Findings discussed and questions answered. Daleen Bo   Medical Decision Making:  This patient is presenting for evaluation of numbness and achiness, which does require a range of treatment options, and is a complaint that involves a moderate risk of morbidity and mortality. The differential diagnoses include paresthesia, CVA, tickborne infection, hypertensive urgency, metabolic disorder. I decided to review old records, and in summary elderly male presenting with nonspecific symptoms, requiring ED evaluation.  I did not require additional historical information from anyone.  Clinical Laboratory Tests Ordered, included CBC and Metabolic panel. Review indicates normal findings. Radiologic Tests Ordered, included CT head, chest x-ray.  I independently Visualized: Radiographic images, which show no acute abnormality    Critical Interventions-clinical evaluation, laboratory testing, radiologic imaging, observation and reassessment.  After These Interventions, the Patient was reevaluated and was found stable for discharge.  Patient with nonspecific symptoms.  He has hypertension is not currently taking his lisinopril.  He is concerned about a tick bite, but has no overt signs of Lyme disease.  Lyme disease titer pending.  Patient will be restarted on his lisinopril for hypertension.  Doubt hypertensive urgency, CVA or metabolic disorder.  Will refer to PCP for blood pressure check in 1 or 2 weeks.  CRITICAL CARE-no Performed by: Daleen Bo  Nursing Notes Reviewed/ Care Coordinated Applicable Imaging Reviewed Interpretation of Laboratory Data incorporated into ED treatment  The patient appears reasonably screened and/or stabilized for discharge and I doubt any other medical condition or other [facility] requiring further screening, evaluation, or treatment in the ED at this time prior to discharge.  Plan: Home Medications-restart lisinopril; Home Treatments-low-sodium diet; return  here if the recommended treatment, does not improve the symptoms; Recommended follow  up-PCP, 1 week and as needed     Final Clinical Impression(s) / ED Diagnoses Final diagnoses:  Numbness  Hypertension, unspecified type    Rx / DC Orders ED Discharge Orders         Ordered    lisinopril (ZESTRIL) 20 MG tablet  Daily        03/15/21 1307           Daleen Bo, MD 03/15/21 1308

## 2021-03-20 LAB — LYME DISEASE, WESTERN BLOT
IgG P18 Ab.: ABSENT
IgG P23 Ab.: ABSENT
IgG P28 Ab.: ABSENT
IgG P30 Ab.: ABSENT
IgG P39 Ab.: ABSENT
IgG P41 Ab.: ABSENT
IgG P45 Ab.: ABSENT
IgG P58 Ab.: ABSENT
IgG P66 Ab.: ABSENT
IgG P93 Ab.: ABSENT
IgM P23 Ab.: ABSENT
IgM P39 Ab.: ABSENT
IgM P41 Ab.: ABSENT
Lyme IgG Wb: NEGATIVE
Lyme IgM Wb: NEGATIVE

## 2021-05-23 DIAGNOSIS — H01004 Unspecified blepharitis left upper eyelid: Secondary | ICD-10-CM | POA: Diagnosis not present

## 2021-05-23 DIAGNOSIS — H01005 Unspecified blepharitis left lower eyelid: Secondary | ICD-10-CM | POA: Diagnosis not present

## 2021-05-23 DIAGNOSIS — H01002 Unspecified blepharitis right lower eyelid: Secondary | ICD-10-CM | POA: Diagnosis not present

## 2021-05-23 DIAGNOSIS — H01001 Unspecified blepharitis right upper eyelid: Secondary | ICD-10-CM | POA: Diagnosis not present

## 2021-10-22 ENCOUNTER — Ambulatory Visit: Payer: Medicare HMO | Admitting: Nurse Practitioner

## 2021-12-17 ENCOUNTER — Other Ambulatory Visit: Payer: Self-pay | Admitting: Family Medicine

## 2021-12-17 ENCOUNTER — Other Ambulatory Visit (HOSPITAL_COMMUNITY): Payer: Self-pay | Admitting: Family Medicine

## 2021-12-17 DIAGNOSIS — Z1211 Encounter for screening for malignant neoplasm of colon: Secondary | ICD-10-CM | POA: Diagnosis not present

## 2021-12-17 DIAGNOSIS — I10 Essential (primary) hypertension: Secondary | ICD-10-CM | POA: Diagnosis not present

## 2021-12-17 DIAGNOSIS — F17200 Nicotine dependence, unspecified, uncomplicated: Secondary | ICD-10-CM

## 2021-12-17 DIAGNOSIS — Z125 Encounter for screening for malignant neoplasm of prostate: Secondary | ICD-10-CM | POA: Diagnosis not present

## 2021-12-17 DIAGNOSIS — M5441 Lumbago with sciatica, right side: Secondary | ICD-10-CM | POA: Diagnosis not present

## 2021-12-17 DIAGNOSIS — Z0189 Encounter for other specified special examinations: Secondary | ICD-10-CM | POA: Diagnosis not present

## 2021-12-17 DIAGNOSIS — M5442 Lumbago with sciatica, left side: Secondary | ICD-10-CM | POA: Diagnosis not present

## 2021-12-20 ENCOUNTER — Encounter: Payer: Self-pay | Admitting: Internal Medicine

## 2021-12-24 DIAGNOSIS — M5441 Lumbago with sciatica, right side: Secondary | ICD-10-CM | POA: Diagnosis not present

## 2021-12-24 DIAGNOSIS — Z125 Encounter for screening for malignant neoplasm of prostate: Secondary | ICD-10-CM | POA: Diagnosis not present

## 2021-12-24 DIAGNOSIS — Z0001 Encounter for general adult medical examination with abnormal findings: Secondary | ICD-10-CM | POA: Diagnosis not present

## 2021-12-24 DIAGNOSIS — I10 Essential (primary) hypertension: Secondary | ICD-10-CM | POA: Diagnosis not present

## 2021-12-24 DIAGNOSIS — M5442 Lumbago with sciatica, left side: Secondary | ICD-10-CM | POA: Diagnosis not present

## 2021-12-24 DIAGNOSIS — F17200 Nicotine dependence, unspecified, uncomplicated: Secondary | ICD-10-CM | POA: Diagnosis not present

## 2021-12-24 DIAGNOSIS — Z1211 Encounter for screening for malignant neoplasm of colon: Secondary | ICD-10-CM | POA: Diagnosis not present

## 2021-12-31 DIAGNOSIS — I10 Essential (primary) hypertension: Secondary | ICD-10-CM | POA: Diagnosis not present

## 2021-12-31 DIAGNOSIS — E7800 Pure hypercholesterolemia, unspecified: Secondary | ICD-10-CM | POA: Diagnosis not present

## 2022-01-15 ENCOUNTER — Other Ambulatory Visit (HOSPITAL_COMMUNITY): Payer: Self-pay | Admitting: Family Medicine

## 2022-01-15 DIAGNOSIS — F5221 Male erectile disorder: Secondary | ICD-10-CM | POA: Diagnosis not present

## 2022-01-15 DIAGNOSIS — M5442 Lumbago with sciatica, left side: Secondary | ICD-10-CM | POA: Diagnosis not present

## 2022-01-15 DIAGNOSIS — I10 Essential (primary) hypertension: Secondary | ICD-10-CM | POA: Diagnosis not present

## 2022-01-15 DIAGNOSIS — M5441 Lumbago with sciatica, right side: Secondary | ICD-10-CM | POA: Diagnosis not present

## 2022-01-17 ENCOUNTER — Other Ambulatory Visit: Payer: Self-pay

## 2022-01-17 ENCOUNTER — Ambulatory Visit (HOSPITAL_COMMUNITY)
Admission: RE | Admit: 2022-01-17 | Discharge: 2022-01-17 | Disposition: A | Discharge status: Home / Self Care | Payer: Medicare HMO | Source: Ambulatory Visit | Attending: Family Medicine | Admitting: Family Medicine

## 2022-01-17 DIAGNOSIS — M5442 Lumbago with sciatica, left side: Secondary | ICD-10-CM | POA: Diagnosis not present

## 2022-01-17 DIAGNOSIS — M4807 Spinal stenosis, lumbosacral region: Secondary | ICD-10-CM | POA: Diagnosis not present

## 2022-01-17 DIAGNOSIS — M5441 Lumbago with sciatica, right side: Secondary | ICD-10-CM | POA: Diagnosis not present

## 2022-01-17 DIAGNOSIS — M5116 Intervertebral disc disorders with radiculopathy, lumbar region: Secondary | ICD-10-CM | POA: Diagnosis not present

## 2022-01-29 DIAGNOSIS — M48062 Spinal stenosis, lumbar region with neurogenic claudication: Secondary | ICD-10-CM | POA: Diagnosis not present

## 2022-01-30 DIAGNOSIS — M47816 Spondylosis without myelopathy or radiculopathy, lumbar region: Secondary | ICD-10-CM | POA: Diagnosis not present

## 2022-01-30 DIAGNOSIS — M48062 Spinal stenosis, lumbar region with neurogenic claudication: Secondary | ICD-10-CM | POA: Diagnosis not present

## 2022-01-30 DIAGNOSIS — M545 Low back pain, unspecified: Secondary | ICD-10-CM | POA: Diagnosis not present

## 2022-02-19 ENCOUNTER — Ambulatory Visit (INDEPENDENT_AMBULATORY_CARE_PROVIDER_SITE_OTHER): Payer: Self-pay | Admitting: *Deleted

## 2022-02-19 VITALS — Ht 68.0 in | Wt 161.0 lb

## 2022-02-19 DIAGNOSIS — Z1211 Encounter for screening for malignant neoplasm of colon: Secondary | ICD-10-CM

## 2022-02-19 NOTE — Progress Notes (Signed)
Gastroenterology Pre-Procedure Review ? ?Request Date: 02/19/2022 ?Requesting Physician: Valentino Nose, FNP-C, no previous TCS ? ?PATIENT REVIEW QUESTIONS: The patient responded to the following health history questions as indicated:   ? ?1. Diabetes Melitis: no ?2. Joint replacements in the past 12 months: no ?3. Major health problems in the past 3 months: yes, back pain, seeing [facility] ?4. Has an artificial valve or MVP: no ?5. Has a defibrillator: no ?6. Has been advised in past to take antibiotics in advance of a procedure like teeth cleaning: no ?7. Family history of colon cancer: no ?8. Alcohol Use: yes, 3 beers a week ?9. Illicit drug Use: no ?10. History of sleep apnea: no ?11. History of coronary artery or other vascular stents placed within the last 12 months: no ?12. History of any prior anesthesia complications: no ?13. Body mass index is 24.48 kg/m?. ?   ?MEDICATIONS & ALLERGIES:    ?Patient reports the following regarding taking any blood thinners:   ?Plavix? no ?Aspirin? no ?Coumadin? no ?Brilinta? no ?Xarelto? no ?Eliquis? no ?Pradaxa? no ?Savaysa? no ?Effient? no ? ?Patient confirms/reports the following medications:  ?Current Outpatient Medications  ?Medication Sig Dispense Refill  ? acetaminophen (TYLENOL) 500 MG tablet Take 500 mg by mouth as needed.    ? amLODipine (NORVASC) 10 MG tablet daily at 6 (six) AM.    ? chlorthalidone (HYGROTON) 25 MG tablet daily at 6 (six) AM.    ? Cholecalciferol (VITAMIN D3 PO) Take by mouth daily at 6 (six) AM.    ? Ferrous Sulfate (IRON PO) Take by mouth. Takes 2 tablets daily.    ? lisinopril (ZESTRIL) 20 MG tablet Take 1 tablet (20 mg total) by mouth daily. 30 tablet 0  ? olmesartan (BENICAR) 40 MG tablet Take 40 mg by mouth daily.    ? ?No current facility-administered medications for this visit.  ? ? ?Patient confirms/reports the following allergies:  ?Allergies  ?Allergen Reactions  ? Amoxicillin Itching  ?  Hot Flashes  ? ? ?No orders  of the defined types were placed in this encounter. ? ? ?AUTHORIZATION INFORMATION ?Primary Insurance: [place],  [place] #: [id number],  Group #: [id number] ?Pre-Cert / Josem Kaufmann required:  ?Pre-Cert / Auth #:  ? ?SCHEDULE INFORMATION: ?Procedure has been scheduled as follows:  ?Date: , Time:   ?Location: [facility] with Dr. Abbey Chatters ? ?This Gastroenterology Pre-Precedure Review Form is being routed to the following provider(s): Venetia Night, NP ?  ?

## 2022-02-20 DIAGNOSIS — M48062 Spinal stenosis, lumbar region with neurogenic claudication: Secondary | ICD-10-CM | POA: Diagnosis not present

## 2022-02-20 NOTE — Progress Notes (Signed)
Prior ASA 3, alcohol use not abuse however would prefer office visit prior to scheduling to better delineate usage and reclassify ASA. ?

## 2022-02-20 NOTE — Progress Notes (Signed)
Spoke to pt.  Informed him that ov needed to arrange TCS.  Scheduled ov for 02/26/2022 at 1:30 with Neil Crouch, PA-C. ?

## 2022-02-26 ENCOUNTER — Other Ambulatory Visit: Payer: Self-pay

## 2022-02-26 ENCOUNTER — Telehealth: Payer: Self-pay

## 2022-02-26 ENCOUNTER — Encounter: Payer: Self-pay | Admitting: Gastroenterology

## 2022-02-26 ENCOUNTER — Ambulatory Visit (INDEPENDENT_AMBULATORY_CARE_PROVIDER_SITE_OTHER): Payer: Medicare HMO | Admitting: Gastroenterology

## 2022-02-26 DIAGNOSIS — Z1211 Encounter for screening for malignant neoplasm of colon: Secondary | ICD-10-CM

## 2022-02-26 MED ORDER — PEG 3350-KCL-NA BICARB-NACL 420 G PO SOLR: 4000.0000 mL | ORAL | 0 refills | Status: DC

## 2022-02-26 NOTE — Progress Notes (Signed)
? ? ? ?GI Office Note   ? ?Referring Provider: Valentino Nose, FNP ?Primary Care Physician:  Valentino Nose, FNP  ?Primary Gastroenterologist: Elon Alas. Abbey Chatters, DO ? ? ?Chief Complaint  ? ?Chief Complaint  ?Patient presents with  ? Colonoscopy  ?  No other issues.   ? ? ? ?History of Present Illness  ? ?Robert Meza is a 71 y.o. male presenting today at the request of Ms. Mare Ferrari for consideration of colonoscopy.  Patient has never had a colonoscopy.  No family history of colon cancer.  Denies any GI complaints.  Bowel movements are regular.  No blood in the stool or melena.  No abdominal pain.  No heartburn, dysphagia.  No unintentional weight loss.  He has a history of electrical injury remotely, electrocuted at work resulting in a fall from a ladder/loss of digit of the left hand/fractured ribs/back injury/head trauma.  Denies any significant permanent injury. ?  ?Medications  ? ?Current Outpatient Medications  ?Medication Sig Dispense Refill  ? acetaminophen (TYLENOL) 500 MG tablet Take 500 mg by mouth as needed.    ? amLODipine (NORVASC) 10 MG tablet daily at 6 (six) AM.    ? chlorthalidone (HYGROTON) 25 MG tablet daily at 6 (six) AM.    ? Cholecalciferol (VITAMIN D3 PO) Take by mouth daily at 6 (six) AM.    ? Ferrous Sulfate (IRON PO) Take by mouth. Takes 2 tablets daily.    ? olmesartan (BENICAR) 40 MG tablet Take 40 mg by mouth daily.    ? pravastatin (PRAVACHOL) 20 MG tablet Take 20 mg by mouth at bedtime.    ? ?No current facility-administered medications for this visit.  ? ? ?Allergies  ? ?Allergies as of 02/26/2022 - Review Complete 02/26/2022  ?Allergen Reaction Noted  ? Amoxicillin Itching 01/07/2012  ? ? ?Past Medical History  ? ?Past Medical History:  ?Diagnosis Date  ? Anxiety   ? DDD (degenerative disc disease), lumbar   ? Depression   ? Diverticulosis   ? Edentulous   ? Electric injury 05/12/2006  ? electricuted while doing electrical work   ? Head injury 05/12/12  ? fall after electicution  ? History  of kidney stones   ? Hypercholesteremia   ? Hypertension   ? ? ?Past Surgical History  ? ?Past Surgical History:  ?Procedure Laterality Date  ? CATARACT EXTRACTION W/PHACO  01/15/2012  ? Procedure: CATARACT EXTRACTION PHACO AND INTRAOCULAR LENS PLACEMENT (IOC);  Surgeon: Tonny Branch, MD;  Location: [facility];  Service: Ophthalmology;  Laterality: Left;  CDE 16.58  ? CATARACT EXTRACTION W/PHACO Right 10/26/2020  ? Procedure: CATARACT EXTRACTION PHACO AND INTRAOCULAR LENS PLACEMENT RIGHT EYE;  Surgeon: Baruch Goldmann, MD;  Location: [facility];  Service: Ophthalmology;  Laterality: Right;  CDE  61.64  ? HERNIA REPAIR  age 89  ? right inguinal  ? left ring finger amputation    ? after electricution   ? TONSILLECTOMY  age 48  ? [facility]  ? ? ?Past Family History  ? ?Family History  ?Problem Relation Age of Onset  ? Cancer Mother   ? Cancer Father   ? Colon cancer Other   ? Anesthesia problems Neg Hx   ? Hypotension Neg Hx   ? Malignant hyperthermia Neg Hx   ? Pseudochol deficiency Neg Hx   ? Diabetes Neg Hx   ? ? ?Past Social History  ? ?Social History  ? ?Socioeconomic History  ? Marital status: Married  ?  Spouse name: Not on  file  ? Number of children: Not on file  ? Years of education: Not on file  ? Highest education level: Not on file  ?Occupational History  ? Not on file  ?Tobacco Use  ? Smoking status: Every Day  ?  Packs/day: 1.00  ?  Years: 25.00  ?  Pack years: 25.00  ?  Types: Cigarettes  ? Smokeless tobacco: Never  ?Vaping Use  ? Vaping Use: Never used  ?Substance and Sexual Activity  ? Alcohol use: Yes  ?  Comment: 3-4 beers per week, occasional shot  ? Drug use: No  ? Sexual activity: Yes  ?  Birth control/protection: None  ?Other Topics Concern  ? Not on file  ?Social History Narrative  ? Not on file  ? ?Social Determinants of Health  ? ?Financial Resource Strain: Not on file  ?Food Insecurity: Not on file  ?Transportation Needs: Not on file  ?Physical Activity: Not on file  ?Stress: Not on file  ?Social Connections:  Not on file  ?Intimate Partner Violence: Not on file  ? ? ?Review of Systems  ? ?General: Negative for anorexia, weight loss, fever, chills, fatigue, weakness. ?Eyes: Negative for vision changes.  ?ENT: Negative for hoarseness, difficulty swallowing , nasal congestion. ?CV: Negative for chest pain, angina, palpitations, dyspnea on exertion, peripheral edema.  ?Respiratory: Negative for dyspnea at rest, dyspnea on exertion, cough, sputum, wheezing.  ?GI: See history of present illness. ?GU:  Negative for dysuria, hematuria, urinary incontinence, urinary frequency, nocturnal urination.  ?MS: Negative for joint pain. Chronic back pain ?Derm: Negative for rash or itching.  ?Neuro: Negative for weakness, abnormal sensation, seizure, frequent headaches, memory loss,  ?confusion.  ?Psych: Negative for anxiety, depression, suicidal ideation, hallucinations.  ?Endo: Negative for unusual weight change.  ?Heme: Negative for bruising or bleeding. ?Allergy: Negative for rash or hives. ? ?Physical Exam  ? ?BP 118/60 (BP Location: Right Arm, Patient Position: Sitting, Cuff Size: Normal)   Pulse 68   Temp 97.7 ?F (36.5 ?C) (Temporal)   Ht '5\' 9"'$  (1.753 m)   Wt 156 lb 6.4 oz (70.9 kg)   SpO2 92%   BMI 23.10 kg/m?  ?  ?General: Well-nourished, well-developed in no acute distress.  ?Head: Normocephalic, atraumatic.   ?Eyes: Conjunctiva pink, no icterus. ?Mouth: Oropharyngeal mucosa moist and pink , no lesions erythema or exudate. Edentulous ?Neck: Supple  ?Lungs: Clear to auscultation bilaterally.  ?Heart: Regular rate and rhythm, no murmurs rubs or gallops.  ?Abdomen: Bowel sounds are normal, nontender, nondistended, no hepatosplenomegaly or masses,  ?no abdominal bruits or hernia, no rebound or guarding.   ?Rectal: deferred ?Extremities: No lower extremity edema. No clubbing or deformities. Amputation of digit left hand ?Neuro: Alert and oriented x 4 , grossly normal neurologically.  ?Skin: Warm and dry, no rash or jaundice.    ?Psych: Alert and cooperative, normal mood and affect. ? ?Labs  ? ?Lab Results  ?Component Value Date  ? CREATININE 0.87 03/15/2021  ? BUN 13 03/15/2021  ? NA 136 03/15/2021  ? K 3.9 03/15/2021  ? CL 102 03/15/2021  ? CO2 28 03/15/2021  ? ?Lab Results  ?Component Value Date  ? ALT 30 03/15/2021  ? AST 31 03/15/2021  ? ALKPHOS 69 03/15/2021  ? BILITOT 0.4 03/15/2021  ? ?Lab Results  ?Component Value Date  ? WBC 4.9 03/15/2021  ? HGB 15.9 03/15/2021  ? HCT 48.3 03/15/2021  ? MCV 91.7 03/15/2021  ? PLT 186 03/15/2021  ? ? ?Imaging Studies  ? ?  No results found. ? ?Assessment  ? ?Encounter for screening colonoscopy: no prior colonoscopy. Denies GI complaints. No FH of CRC.  ? ?PLAN  ? ? ?Colonoscopy.  ASA 3.  I have discussed the risks, alternatives, benefits with regards to but not limited to the risk of reaction to medication, bleeding, infection, perforation and the patient is agreeable to proceed. Written consent to be obtained. ? ? ?Laureen Ochs. , MHS, PA-C ?[facility] ? ?

## 2022-02-26 NOTE — Patient Instructions (Signed)
Colonoscopy to be scheduled. See separate instructions.  ?

## 2022-02-26 NOTE — Telephone Encounter (Signed)
PA for TCS submitted via [facility] website. PA# [id number], valid 03/31/22-06/29/22. ?

## 2022-03-17 DIAGNOSIS — G5731 Lesion of lateral popliteal nerve, right lower limb: Secondary | ICD-10-CM | POA: Diagnosis not present

## 2022-03-17 DIAGNOSIS — G5732 Lesion of lateral popliteal nerve, left lower limb: Secondary | ICD-10-CM | POA: Diagnosis not present

## 2022-03-20 DIAGNOSIS — M48062 Spinal stenosis, lumbar region with neurogenic claudication: Secondary | ICD-10-CM | POA: Diagnosis not present

## 2022-03-25 NOTE — Patient Instructions (Addendum)
? ? ? ? ? ? Robert Meza ? 03/25/2022  ?  ? '@PREFPERIOPPHARMACY'$ @ ? ? Your procedure is scheduled on  03/31/2022. ? ? Report to [facility] at  [place]  A.[facility]. ? ? Call this number if you have problems the morning of surgery: ? [phone] ? ? Remember: ? Follow the diet and prep instructions given to you by the office. ?  ? Take these medicines the morning of surgery with A SIP OF WATER  ? ?                                         None. ?  ? ? Do not wear jewelry, make-up or nail polish. ? Do not wear lotions, powders, or perfumes, or deodorant. ? Do not shave 48 hours prior to surgery.  Men may shave face and neck. ? Do not bring valuables to the hospital. ? [facility] is not responsible for any belongings or valuables. ? ?Contacts, dentures or bridgework may not be worn into surgery.  Leave your suitcase in the car.  After surgery it may be brought to your room. ? ?For patients admitted to the hospital, discharge time will be determined by your treatment team. ? ?Patients discharged the day of surgery will not be allowed to drive home and must have someone with them for 24 hours.  ? ? ?Special instructions:   DO NOT smoke tobacco or vape for 24 hours before your procedure. ? ?Please read over the following fact sheets that you were given. ?Anesthesia Post-op Instructions and Care and Recovery After Surgery ?  ? ? ? Colonoscopy, Adult, Care After ?The following information offers guidance on how to care for yourself after your procedure. Your health care provider may also give you more specific instructions. If you have problems or questions, contact your health care provider. ?What can I expect after the procedure? ?After the procedure, it is common to have: ?A small amount of blood in your stool for 24 hours after the procedure. ?Some gas. ?Mild cramping or bloating of your abdomen. ?Follow these instructions at home: ?Eating and drinking ? ?Drink enough fluid to keep your urine pale yellow. ?Follow instructions from  your health care provider about eating or drinking restrictions. ?Resume your normal diet as told by your health care provider. Avoid heavy or fried foods that are hard to digest. ?Activity ?Rest as told by your health care provider. ?Avoid sitting for a long time without moving. Get up to take short walks every 1-2 hours. This is important to improve blood flow and breathing. Ask for help if you feel weak or unsteady. ?Return to your normal activities as told by your health care provider. Ask your health care provider what activities are safe for you. ?Managing cramping and bloating ? ?Try walking around when you have cramps or feel bloated. ?If directed, apply heat to your abdomen as told by your health care provider. Use the heat source that your health care provider recommends, such as a moist heat pack or a heating pad. ?Place a towel between your skin and the heat source. ?Leave the heat on for 20-30 minutes. ?Remove the heat if your skin turns bright red. This is especially important if you are unable to feel pain, heat, or cold. You have a greater risk of getting burned. ?General instructions ?If you were given a sedative during the procedure,  it can affect you for several hours. Do not drive or operate machinery until your health care provider says that it is safe. ?For the first 24 hours after the procedure: ?Do not sign important documents. ?Do not drink alcohol. ?Do your regular daily activities at a slower pace than normal. ?Eat soft foods that are easy to digest. ?Take over-the-counter and prescription medicines only as told by your health care provider. ?Keep all follow-up visits. This is important. ?Contact a health care provider if: ?You have blood in your stool 2-3 days after the procedure. ?Get help right away if: ?You have more than a small spotting of blood in your stool. ?You have large blood clots in your stool. ?You have swelling of your abdomen. ?You have nausea or vomiting. ?You have a  fever. ?You have increasing pain in your abdomen that is not relieved with medicine. ?These symptoms may be an emergency. Get help right away. Call 911. ?Do not wait to see if the symptoms will go away. ?Do not drive yourself to the hospital. ?Summary ?After the procedure, it is common to have a small amount of blood in your stool. You may also have mild cramping and bloating of your abdomen. ?If you were given a sedative during the procedure, it can affect you for several hours. Do not drive or operate machinery until your health care provider says that it is safe. ?Get help right away if you have a lot of blood in your stool, nausea or vomiting, a fever, or increased pain in your abdomen. ?This information is not intended to replace advice given to you by your health care provider. Make sure you discuss any questions you have with your health care provider. ?Document Revised: 06/19/2021 Document Reviewed: 06/19/2021 ?Elsevier Patient Education ? [place]. ?Monitored Anesthesia Care, Care After ?This sheet gives you information about how to care for yourself after your procedure. Your health care provider may also give you more specific instructions. If you have problems or questions, contact your health care provider. ?What can I expect after the procedure? ?After the procedure, it is common to have: ?Tiredness. ?Forgetfulness about what happened after the procedure. ?Impaired judgment for important decisions. ?Nausea or vomiting. ?Some difficulty with balance. ?Follow these instructions at home: ?For the time period you were told by your health care provider: ? ?  ? ?Rest as needed. ?Do not participate in activities where you could fall or become injured. ?Do not drive or use machinery. ?Do not drink alcohol. ?Do not take sleeping pills or medicines that cause drowsiness. ?Do not make important decisions or sign legal documents. ?Do not take care of children on your own. ?Eating and drinking ?Follow the  diet that is recommended by your health care provider. ?Drink enough fluid to keep your urine pale yellow. ?If you vomit: ?Drink water, juice, or soup when you can drink without vomiting. ?Make sure you have little or no nausea before eating solid foods. ?General instructions ?Have a responsible adult stay with you for the time you are told. It is important to have someone help care for you until you are awake and alert. ?Take over-the-counter and prescription medicines only as told by your health care provider. ?If you have sleep apnea, surgery and certain medicines can increase your risk for breathing problems. Follow instructions from your health care provider about wearing your sleep device: ?Anytime you are sleeping, including during daytime naps. ?While taking prescription pain medicines, sleeping medicines, or medicines that make you drowsy. ?  Avoid smoking. ?Keep all follow-up visits as told by your health care provider. This is important. ?Contact a health care provider if: ?You keep feeling nauseous or you keep vomiting. ?You feel light-headed. ?You are still sleepy or having trouble with balance after 24 hours. ?You develop a rash. ?You have a fever. ?You have redness or swelling around the IV site. ?Get help right away if: ?You have trouble breathing. ?You have new-onset confusion at home. ?Summary ?For several hours after your procedure, you may feel tired. You may also be forgetful and have poor judgment. ?Have a responsible adult stay with you for the time you are told. It is important to have someone help care for you until you are awake and alert. ?Rest as told. Do not drive or operate machinery. Do not drink alcohol or take sleeping pills. ?Get help right away if you have trouble breathing, or if you suddenly become confused. ?This information is not intended to replace advice given to you by your health care provider. Make sure you discuss any questions you have with your health care  provider. ?Document Revised: 10/01/2021 Document Reviewed: 09/29/2019 ?Elsevier Patient Education ? [place]. ? ? ?

## 2022-03-27 ENCOUNTER — Encounter (HOSPITAL_COMMUNITY)
Admission: RE | Admit: 2022-03-27 | Discharge: 2022-03-27 | Disposition: A | Discharge status: Home / Self Care | Payer: Medicare HMO | Source: Ambulatory Visit | Attending: Internal Medicine | Admitting: Internal Medicine

## 2022-03-27 ENCOUNTER — Encounter (HOSPITAL_COMMUNITY): Payer: Self-pay

## 2022-03-27 VITALS — BP 113/58 | HR 61 | Temp 97.5°F | Resp 18 | Ht 69.0 in | Wt 161.0 lb

## 2022-03-27 DIAGNOSIS — Z01810 Encounter for preprocedural cardiovascular examination: Secondary | ICD-10-CM | POA: Diagnosis not present

## 2022-03-27 DIAGNOSIS — I10 Essential (primary) hypertension: Secondary | ICD-10-CM | POA: Diagnosis not present

## 2022-03-31 ENCOUNTER — Ambulatory Visit (HOSPITAL_COMMUNITY)
Admission: RE | Admit: 2022-03-31 | Discharge: 2022-03-31 | Disposition: A | Discharge status: Home / Self Care | Payer: Medicare HMO | Attending: Internal Medicine | Admitting: Internal Medicine

## 2022-03-31 ENCOUNTER — Ambulatory Visit (HOSPITAL_COMMUNITY): Payer: Medicare HMO | Admitting: Anesthesiology

## 2022-03-31 ENCOUNTER — Encounter (HOSPITAL_COMMUNITY)
Admission: RE | Disposition: A | Discharge status: Home / Self Care | Payer: Self-pay | Source: Home / Self Care | Attending: Internal Medicine

## 2022-03-31 ENCOUNTER — Encounter (HOSPITAL_COMMUNITY): Payer: Self-pay

## 2022-03-31 ENCOUNTER — Ambulatory Visit (HOSPITAL_BASED_OUTPATIENT_CLINIC_OR_DEPARTMENT_OTHER): Payer: Medicare HMO | Admitting: Anesthesiology

## 2022-03-31 DIAGNOSIS — F419 Anxiety disorder, unspecified: Secondary | ICD-10-CM | POA: Diagnosis not present

## 2022-03-31 DIAGNOSIS — K5730 Diverticulosis of large intestine without perforation or abscess without bleeding: Secondary | ICD-10-CM

## 2022-03-31 DIAGNOSIS — D124 Benign neoplasm of descending colon: Secondary | ICD-10-CM | POA: Diagnosis not present

## 2022-03-31 DIAGNOSIS — D120 Benign neoplasm of cecum: Secondary | ICD-10-CM | POA: Diagnosis not present

## 2022-03-31 DIAGNOSIS — K648 Other hemorrhoids: Secondary | ICD-10-CM | POA: Insufficient documentation

## 2022-03-31 DIAGNOSIS — F17200 Nicotine dependence, unspecified, uncomplicated: Secondary | ICD-10-CM | POA: Diagnosis not present

## 2022-03-31 DIAGNOSIS — I10 Essential (primary) hypertension: Secondary | ICD-10-CM | POA: Diagnosis not present

## 2022-03-31 DIAGNOSIS — K635 Polyp of colon: Secondary | ICD-10-CM

## 2022-03-31 DIAGNOSIS — Z79899 Other long term (current) drug therapy: Secondary | ICD-10-CM | POA: Diagnosis not present

## 2022-03-31 DIAGNOSIS — Z1211 Encounter for screening for malignant neoplasm of colon: Secondary | ICD-10-CM | POA: Insufficient documentation

## 2022-03-31 DIAGNOSIS — F32A Depression, unspecified: Secondary | ICD-10-CM | POA: Diagnosis not present

## 2022-03-31 DIAGNOSIS — F418 Other specified anxiety disorders: Secondary | ICD-10-CM | POA: Diagnosis not present

## 2022-03-31 DIAGNOSIS — M1990 Unspecified osteoarthritis, unspecified site: Secondary | ICD-10-CM | POA: Diagnosis not present

## 2022-03-31 HISTORY — PX: POLYPECTOMY: SHX149

## 2022-03-31 HISTORY — PX: COLONOSCOPY WITH PROPOFOL: SHX5780

## 2022-03-31 SURGERY — COLONOSCOPY WITH PROPOFOL
Anesthesia: General

## 2022-03-31 MED ORDER — LACTATED RINGERS IV SOLN: INTRAVENOUS | Status: DC

## 2022-03-31 MED ORDER — LIDOCAINE HCL (CARDIAC) PF 100 MG/5ML IV SOSY
PREFILLED_SYRINGE | INTRAVENOUS | Status: DC | PRN
  Administered 2022-03-31: 50 mg via INTRAVENOUS

## 2022-03-31 MED ORDER — PROPOFOL 10 MG/ML IV BOLUS
INTRAVENOUS | Status: DC | PRN
  Administered 2022-03-31: 100 mg via INTRAVENOUS

## 2022-03-31 MED ORDER — EPHEDRINE SULFATE (PRESSORS) 50 MG/ML IJ SOLN
INTRAMUSCULAR | Status: DC | PRN
  Administered 2022-03-31 (×2): 10 mg via INTRAVENOUS
  Administered 2022-03-31: 5 mg via INTRAVENOUS

## 2022-03-31 MED ORDER — PROPOFOL 500 MG/50ML IV EMUL
INTRAVENOUS | Status: DC | PRN
  Administered 2022-03-31: 125 ug/kg/min via INTRAVENOUS

## 2022-03-31 MED ORDER — PHENYLEPHRINE 80 MCG/ML (10ML) SYRINGE FOR IV PUSH (FOR BLOOD PRESSURE SUPPORT)
PREFILLED_SYRINGE | INTRAVENOUS | Status: DC | PRN
  Administered 2022-03-31: 80 ug via INTRAVENOUS

## 2022-03-31 NOTE — Anesthesia Preprocedure Evaluation (Signed)
Anesthesia Evaluation  Patient identified by MRN, date of birth, ID band Patient awake    Reviewed: Allergy & Precautions, NPO status , Patient's Chart, lab work & pertinent test results  Airway Mallampati: II  TM Distance: >3 FB Neck ROM: Full    Dental  (+) Edentulous Upper, Edentulous Lower   Pulmonary Current Smoker and Patient abstained from smoking.,    Pulmonary exam normal breath sounds clear to auscultation       Cardiovascular hypertension, Pt. on medications Normal cardiovascular exam Rhythm:Regular Rate:Normal     Neuro/Psych PSYCHIATRIC DISORDERS Anxiety Depression    GI/Hepatic negative GI ROS, Neg liver ROS,   Endo/Other  negative endocrine ROS  Renal/GU negative Renal ROS  negative genitourinary   Musculoskeletal  (+) Arthritis , Osteoarthritis,    Abdominal   Peds negative pediatric ROS (+)  Hematology negative hematology ROS (+)   Anesthesia Other Findings   Reproductive/Obstetrics negative OB ROS                           Anesthesia Physical Anesthesia Plan  ASA: 2  Anesthesia Plan: General   Post-op Pain Management: Minimal or no pain anticipated   Induction: Intravenous  PONV Risk Score and Plan: Propofol infusion  Airway Management Planned:   Additional Equipment:   Intra-op Plan:   Post-operative Plan:   Informed Consent: I have reviewed the patients History and Physical, chart, labs and discussed the procedure including the risks, benefits and alternatives for the proposed anesthesia with the patient or authorized representative who has indicated his/her understanding and acceptance.       Plan Discussed with: CRNA and Surgeon  Anesthesia Plan Comments:         Anesthesia Quick Evaluation

## 2022-03-31 NOTE — Discharge Instructions (Addendum)
  Colonoscopy Discharge Instructions  Read the instructions outlined below and refer to this sheet in the next few weeks. These discharge instructions provide you with general information on caring for yourself after you leave the hospital. Your doctor may also give you specific instructions. While your treatment has been planned according to the most current medical practices available, unavoidable complications occasionally occur.   ACTIVITY You may resume your regular activity, but move at a slower pace for the next 24 hours.  Take frequent rest periods for the next 24 hours.  Walking will help get rid of the air and reduce the bloated feeling in your belly (abdomen).  No driving for 24 hours (because of the medicine (anesthesia) used during the test).   Do not sign any important legal documents or operate any machinery for 24 hours (because of the anesthesia used during the test).  NUTRITION Drink plenty of fluids.  You may resume your normal diet as instructed by your doctor.  Begin with a light meal and progress to your normal diet. Heavy or fried foods are harder to digest and may make you feel sick to your stomach (nauseated).  Avoid alcoholic beverages for 24 hours or as instructed.  MEDICATIONS You may resume your normal medications unless your doctor tells you otherwise.  WHAT YOU CAN EXPECT TODAY Some feelings of bloating in the abdomen.  Passage of more gas than usual.  Spotting of blood in your stool or on the toilet paper.  IF YOU HAD POLYPS REMOVED DURING THE COLONOSCOPY: No aspirin products for 7 days or as instructed.  No alcohol for 7 days or as instructed.  Eat a soft diet for the next 24 hours.  FINDING OUT THE RESULTS OF YOUR TEST Not all test results are available during your visit. If your test results are not back during the visit, make an appointment with your caregiver to find out the results. Do not assume everything is normal if you have not heard from your  caregiver or the medical facility. It is important for you to follow up on all of your test results.  SEEK IMMEDIATE MEDICAL ATTENTION IF: You have more than a spotting of blood in your stool.  Your belly is swollen (abdominal distention).  You are nauseated or vomiting.  You have a temperature over 101.  You have abdominal pain or discomfort that is severe or gets worse throughout the day.   Your colonoscopy revealed 3 polyp(s) which I removed successfully. Await pathology results, my office will contact you. I recommend repeating colonoscopy in 3 years for surveillance purposes.   You also have diverticulosis and internal hemorrhoids. I would recommend increasing fiber in your diet or adding OTC Benefiber/Metamucil. Be sure to drink at least 4 to 6 glasses of water daily. Follow-up with GI as needed.   I hope you have a great rest of your week!  Elon Alas. Abbey Chatters, D.O. Gastroenterology and Hepatology [facility]

## 2022-03-31 NOTE — Transfer of Care (Signed)
Immediate Anesthesia Transfer of Care Note  Patient: Robert Meza  Procedure(s) Performed: COLONOSCOPY WITH PROPOFOL POLYPECTOMY INTESTINAL  Patient Location: Short Stay  Anesthesia Type:General  Level of Consciousness: drowsy  Airway & Oxygen Therapy: Patient Spontanous Breathing  Post-op Assessment: Report given to RN and Post -op Vital signs reviewed and stable  Post vital signs: Reviewed and stable  Last Vitals:  Vitals Value Taken Time  BP    Temp    Pulse    Resp    SpO2      Last Pain:  Vitals:   03/31/22 0935  TempSrc:   PainSc: 0-No pain      Patients Stated Pain Goal: 8 (01/49/96 9249)  Complications: No notable events documented.

## 2022-03-31 NOTE — OR Nursing (Signed)
Patient reported aching 8/10 pain in abdomen after procedure. Dr. Abbey Chatters notified. Patient helped to move around and sit on the side of the bed. Patient reported pain had eased to a 5/10. Dr Abbey Chatters came in to assess patient and patient reported pain had eased to a 3/10. Patient was instructed to call if pain was to get worse when they got home. All vitals stable and pain 3/10 when patient was discharged.

## 2022-03-31 NOTE — H&P (Signed)
Primary Care Physician:  Valentino Nose, FNP Primary Gastroenterologist:  Dr. Abbey Chatters  Pre-Procedure History & Physical: HPI:  Robert Meza is a 71 y.o. male is here for first ever colonoscopy for colon cancer screening purposes.  Patient denies any family history of colorectal cancer.  No melena or hematochezia.  No abdominal pain or unintentional weight loss.  No change in bowel habits.  Overall feels well from a GI standpoint.  Past Medical History:  Diagnosis Date   Anxiety    DDD (degenerative disc disease), lumbar    Depression    Diverticulosis    Edentulous    Electric injury 05/12/2006   electricuted while doing electrical work    Head injury 05/12/12   fall after electicution   History of kidney stones    Hypercholesteremia    Hypertension     Past Surgical History:  Procedure Laterality Date   CATARACT EXTRACTION W/PHACO  01/15/2012   Procedure: CATARACT EXTRACTION PHACO AND INTRAOCULAR LENS PLACEMENT ([place]);  Surgeon: Tonny Branch, MD;  Location: [facility];  Service: Ophthalmology;  Laterality: Left;  CDE 16.58   CATARACT EXTRACTION W/PHACO Right 10/26/2020   Procedure: CATARACT EXTRACTION PHACO AND INTRAOCULAR LENS PLACEMENT RIGHT EYE;  Surgeon: Baruch Goldmann, MD;  Location: [facility];  Service: Ophthalmology;  Laterality: Right;  CDE  61.64   HERNIA REPAIR  age 10   right inguinal   left ring finger amputation     after electricution    TONSILLECTOMY  age 70   [facility]    Prior to Admission medications   Medication Sig Start Date End Date Taking? Authorizing Provider  acetaminophen (TYLENOL) 500 MG tablet Take 1,000 mg by mouth every 6 (six) hours as needed (pain.).   Yes [provider]  amLODipine (NORVASC) 10 MG tablet Take 10 mg by mouth at bedtime.   Yes [provider]  Aspirin-Acetaminophen-Caffeine (GOODYS EXTRA STRENGTH) [phone] MG PACK Take 1 packet by mouth daily as needed (pain.).   Yes [provider]  chlorthalidone (HYGROTON) 25 MG  tablet Take 25 mg by mouth in the morning.   Yes [provider]  olmesartan (BENICAR) 40 MG tablet Take 40 mg by mouth in the morning. 02/17/22  Yes [provider]  polyethylene glycol-electrolytes (TRILYTE) 420 g solution Take 4,000 mLs by mouth as directed. 02/26/22  Yes Eloise Harman, DO  Multiple Vitamin (MULTIVITAMIN WITH MINERALS) TABS tablet Take 2 tablets by mouth in the morning.    [provider]  pravastatin (PRAVACHOL) 20 MG tablet Take 20 mg by mouth at bedtime. 01/29/22   [provider]  Vitamin D3 (VITAMIN D) 25 MCG tablet Take 2,000 Units by mouth in the morning.    [provider]    Allergies as of 02/26/2022 - Review Complete 02/26/2022  Allergen Reaction Noted   Amoxicillin Itching 01/07/2012    Family History  Problem Relation Age of Onset   Cancer Mother    Cancer Father    Colon cancer Other    Anesthesia problems Neg Hx    Hypotension Neg Hx    Malignant hyperthermia Neg Hx    Pseudochol deficiency Neg Hx    Diabetes Neg Hx     Social History   Socioeconomic History   Marital status: Married    Spouse name: Not on file   Number of children: Not on file   Years of education: Not on file   Highest education level: Not on file  Occupational History  Not on file  Tobacco Use   Smoking status: Every Day    Packs/day: 1.00    Years: 25.00    Pack years: 25.00    Types: Cigarettes   Smokeless tobacco: Never  Vaping Use   Vaping Use: Never used  Substance and Sexual Activity   Alcohol use: Yes    Comment: 3-4 beers per week, occasional shot   Drug use: No   Sexual activity: Yes    Birth control/protection: None  Other Topics Concern   Not on file  Social History Narrative   Not on file   Social Determinants of Health   Financial Resource Strain: Not on file  Food Insecurity: Not on file  Transportation Needs: Not on file  Physical Activity: Not on file  Stress: Not on file  Social  Connections: Not on file  Intimate Partner Violence: Not on file    Review of Systems: See HPI, otherwise negative ROS  Physical Exam: Vital signs in last 24 hours: Temp:  [97.7 F (36.5 C)] 97.7 F (36.5 C) (05/22 0842) Pulse Rate:  [53] 53 (05/22 0842) Resp:  [18] 18 (05/22 0842) BP: (111)/(63) 111/63 (05/22 0842) SpO2:  [99 %] 99 % (05/22 0842) Weight:  [72.6 kg] 72.6 kg (05/22 0842)   General:   Alert,  Well-developed, well-nourished, pleasant and cooperative in NAD Head:  Normocephalic and atraumatic. Eyes:  Sclera clear, no icterus.   Conjunctiva pink. Ears:  Normal auditory acuity. Nose:  No deformity, discharge,  or lesions. Mouth:  No deformity or lesions, dentition normal. Neck:  Supple; no masses or thyromegaly. Lungs:  Clear throughout to auscultation.   No wheezes, crackles, or rhonchi. No acute distress. Heart:  Regular rate and rhythm; no murmurs, clicks, rubs,  or gallops. Abdomen:  Soft, nontender and nondistended. No masses, hepatosplenomegaly or hernias noted. Normal bowel sounds, without guarding, and without rebound.   Msk:  Symmetrical without gross deformities. Normal posture. Extremities:  Without clubbing or edema. Neurologic:  Alert and  oriented x4;  grossly normal neurologically. Skin:  Intact without significant lesions or rashes. Cervical Nodes:  No significant cervical adenopathy. Psych:  Alert and cooperative. Normal mood and affect.  Impression/Plan: Robert Meza is here for a colonoscopy to be performed for colon cancer screening purposes.  The risks of the procedure including infection, bleed, or perforation as well as benefits, limitations, alternatives and imponderables have been reviewed with the patient. Questions have been answered. All parties agreeable.

## 2022-03-31 NOTE — Anesthesia Postprocedure Evaluation (Signed)
Anesthesia Post Note  Patient: Robert Meza  Procedure(s) Performed: COLONOSCOPY WITH PROPOFOL POLYPECTOMY INTESTINAL  Patient location during evaluation: Phase II Anesthesia Type: General Level of consciousness: awake and alert and oriented Pain management: pain level controlled Vital Signs Assessment: post-procedure vital signs reviewed and stable Respiratory status: spontaneous breathing, nonlabored ventilation and respiratory function stable Cardiovascular status: blood pressure returned to baseline and stable Postop Assessment: no apparent nausea or vomiting Anesthetic complications: no   No notable events documented.   Last Vitals:  Vitals:   03/31/22 0842 03/31/22 1001  BP: 111/63 (!) 93/45  Pulse: (!) 53 61  Resp: 18 20  Temp: 36.5 C (!) 36.3 C  SpO2: 99% 97%    Last Pain:  Vitals:   03/31/22 1016  TempSrc:   PainSc: 3                   C 

## 2022-03-31 NOTE — Op Note (Signed)
[facility] Patient Name: Robert Meza Procedure Date: 03/31/2022 9:32 AM MRN: [id number] Date of Birth: 18-Feb-1951 Attending MD: Elon Alas. Abbey Chatters DO CSN: [id number] Age: 71 Admit Type: Outpatient Procedure:                Colonoscopy Indications:              Screening for colorectal malignant neoplasm Providers:                Elon Alas. Abbey Chatters, DO, Caprice Kluver, Crystal Page Referring MD:              Medicines:                See the Anesthesia note for documentation of the                            administered medications Complications:            No immediate complications. Estimated Blood Loss:     Estimated blood loss was minimal. Procedure:                Pre-Anesthesia Assessment:                           - The anesthesia plan was to use monitored                            anesthesia care (MAC).                           After obtaining informed consent, the colonoscope                            was passed under direct vision. Throughout the                            procedure, the patient's blood pressure, pulse, and                            oxygen saturations were monitored continuously. The                            PCF-HQ190L ([id number]) scope was introduced through                            the anus and advanced to the the cecum, identified                            by appendiceal orifice and ileocecal valve. The                            colonoscopy was performed without difficulty. The                            patient tolerated the procedure well. The quality                            of  the bowel preparation was evaluated using the                            BBPS [facility] Bowel Preparation Scale) with scores                            of: Right Colon = 3, Transverse Colon = 3 and Left                            Colon = 3 (entire mucosa seen well with no residual                            staining, small fragments of stool or opaque                             liquid). The total BBPS score equals 9. Scope In: 9:38:56 AM Scope Out: 9:57:36 AM Scope Withdrawal Time: 0 hours 17 minutes 12 seconds  Total Procedure Duration: 0 hours 18 minutes 40 seconds  Findings:      The perianal and digital rectal examinations were normal.      Non-bleeding internal hemorrhoids were found during endoscopy.      Multiple medium-mouthed diverticula were found in the sigmoid colon.      A 13 mm polyp was found in the cecum. The polyp was sessile. The polyp       was removed with a hot snare. Resection and retrieval were complete.      A 4 mm polyp was found in the ascending colon. The polyp was sessile.       The polyp was removed with a cold snare. Resection and retrieval were       complete.      A 5 mm polyp was found in the descending colon. The polyp was sessile.       The polyp was removed with a cold snare. Resection and retrieval were       complete. Impression:               - Non-bleeding internal hemorrhoids.                           - Diverticulosis in the sigmoid colon.                           - One 13 mm polyp in the cecum, removed with a hot                            snare. Resected and retrieved.                           - One 4 mm polyp in the ascending colon, removed                            with a cold snare. Resected and retrieved.                           - One 5 mm polyp in  the descending colon, removed                            with a cold snare. Resected and retrieved. Moderate Sedation:      Per Anesthesia Care Recommendation:           - Patient has a contact number available for                            emergencies. The signs and symptoms of potential                            delayed complications were discussed with the                            patient. Return to normal activities tomorrow.                            Written discharge instructions were provided to the                            patient.                            - Resume previous diet.                           - Continue present medications.                           - Await pathology results.                           - Repeat colonoscopy in 3 years for surveillance.                           - Return to GI clinic PRN. Procedure Code(s):        --- Professional ---                           [phone], Colonoscopy, flexible; with removal of                            tumor(s), polyp(s), or other lesion(s) by snare                            technique Diagnosis Code(s):        --- Professional ---                           Z12.11, Encounter for screening for malignant                            neoplasm of colon                           K64.8, Other hemorrhoids  K63.5, Polyp of colon                           K57.30, Diverticulosis of large intestine without                            perforation or abscess without bleeding CPT copyright 2019 American [employer] Association. All rights reserved. The codes documented in this report are preliminary and upon coder review may  be revised to meet current compliance requirements. Elon Alas. Abbey Chatters, DO [place] Abbey Chatters, DO 03/31/2022 10:00:31 AM This report has been signed electronically. Number of Addenda: 0

## 2022-04-01 LAB — SURGICAL PATHOLOGY

## 2022-04-08 ENCOUNTER — Encounter (HOSPITAL_COMMUNITY): Payer: Self-pay | Admitting: Internal Medicine

## 2022-04-24 DIAGNOSIS — M48062 Spinal stenosis, lumbar region with neurogenic claudication: Secondary | ICD-10-CM | POA: Diagnosis not present

## 2022-05-30 ENCOUNTER — Ambulatory Visit (HOSPITAL_COMMUNITY)
Admission: RE | Admit: 2022-05-30 | Discharge: 2022-05-30 | Disposition: A | Discharge status: Home / Self Care | Payer: Medicare HMO | Source: Ambulatory Visit | Attending: Family Medicine | Admitting: Family Medicine

## 2022-05-30 DIAGNOSIS — Z122 Encounter for screening for malignant neoplasm of respiratory organs: Secondary | ICD-10-CM | POA: Insufficient documentation

## 2022-05-30 DIAGNOSIS — R911 Solitary pulmonary nodule: Secondary | ICD-10-CM | POA: Diagnosis not present

## 2022-05-30 DIAGNOSIS — I700 Atherosclerosis of aorta: Secondary | ICD-10-CM | POA: Diagnosis not present

## 2022-05-30 DIAGNOSIS — Z87891 Personal history of nicotine dependence: Secondary | ICD-10-CM | POA: Diagnosis not present

## 2022-05-30 DIAGNOSIS — F17210 Nicotine dependence, cigarettes, uncomplicated: Secondary | ICD-10-CM | POA: Diagnosis not present

## 2022-05-30 DIAGNOSIS — J439 Emphysema, unspecified: Secondary | ICD-10-CM | POA: Insufficient documentation

## 2022-05-30 DIAGNOSIS — F17200 Nicotine dependence, unspecified, uncomplicated: Secondary | ICD-10-CM

## 2022-06-10 DIAGNOSIS — I10 Essential (primary) hypertension: Secondary | ICD-10-CM | POA: Diagnosis not present

## 2022-06-16 DIAGNOSIS — M5442 Lumbago with sciatica, left side: Secondary | ICD-10-CM | POA: Diagnosis not present

## 2022-06-16 DIAGNOSIS — Z1211 Encounter for screening for malignant neoplasm of colon: Secondary | ICD-10-CM | POA: Diagnosis not present

## 2022-06-16 DIAGNOSIS — Z125 Encounter for screening for malignant neoplasm of prostate: Secondary | ICD-10-CM | POA: Diagnosis not present

## 2022-06-16 DIAGNOSIS — R42 Dizziness and giddiness: Secondary | ICD-10-CM | POA: Diagnosis not present

## 2022-06-16 DIAGNOSIS — M5441 Lumbago with sciatica, right side: Secondary | ICD-10-CM | POA: Diagnosis not present

## 2022-06-16 DIAGNOSIS — Z0000 Encounter for general adult medical examination without abnormal findings: Secondary | ICD-10-CM | POA: Diagnosis not present

## 2022-06-16 DIAGNOSIS — E782 Mixed hyperlipidemia: Secondary | ICD-10-CM | POA: Diagnosis not present

## 2022-06-16 DIAGNOSIS — F17200 Nicotine dependence, unspecified, uncomplicated: Secondary | ICD-10-CM | POA: Diagnosis not present

## 2022-06-16 DIAGNOSIS — I10 Essential (primary) hypertension: Secondary | ICD-10-CM | POA: Diagnosis not present

## 2022-06-26 ENCOUNTER — Other Ambulatory Visit (HOSPITAL_COMMUNITY): Payer: Self-pay | Admitting: Neurological Surgery

## 2022-06-26 ENCOUNTER — Ambulatory Visit (HOSPITAL_COMMUNITY)
Admission: RE | Admit: 2022-06-26 | Discharge: 2022-06-26 | Disposition: A | Discharge status: Home / Self Care | Payer: Medicare HMO | Source: Ambulatory Visit | Attending: Vascular Surgery | Admitting: Vascular Surgery

## 2022-06-26 DIAGNOSIS — I739 Peripheral vascular disease, unspecified: Secondary | ICD-10-CM

## 2022-06-30 DIAGNOSIS — M48062 Spinal stenosis, lumbar region with neurogenic claudication: Secondary | ICD-10-CM | POA: Diagnosis not present

## 2022-07-12 IMAGING — CT CT HEAD W/O CM
3 series · 16 of 47 positions shown, 19 images · non-contrast
Comparison: None.

CLINICAL DATA: Numbness to the right lower lip beginning 2 days
ago. Tightness in the calves. Intermittent fevers. History of
hypertension.

EXAM:
CT HEAD WITHOUT CONTRAST
TECHNIQUE: Contiguous axial images were obtained from the base of the skull
through the vertex without intravenous contrast.

[Series 2: head w o · axial · 0.46mm/px · z∈[+1479,+1614]mm · 10 of 33 slices shown, 13 images]
[im 3/33  brain]
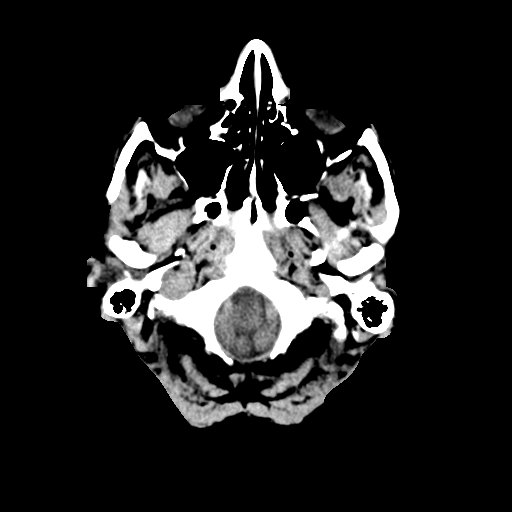
[im 3/33  bone]
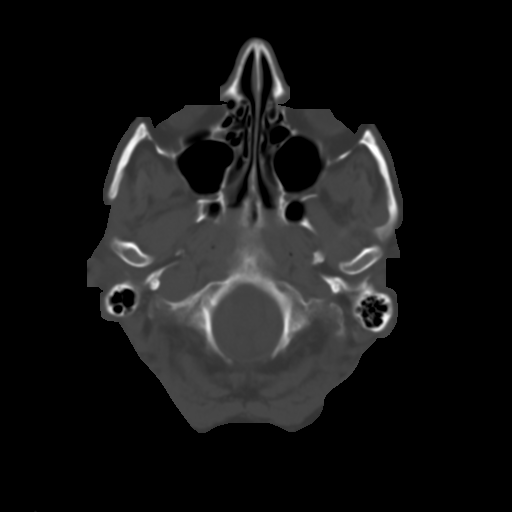
[im 6/33  brain]
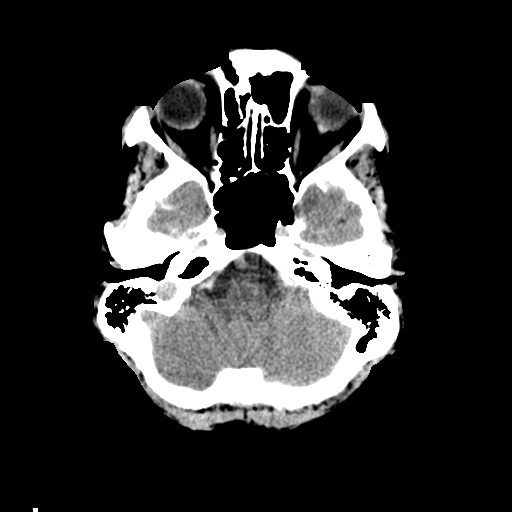
[im 9/33  brain]
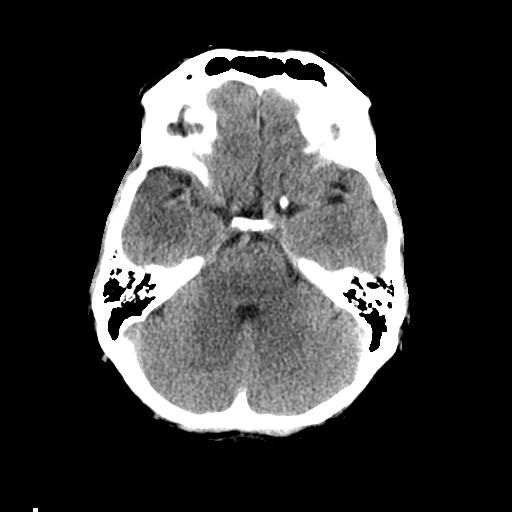
[im 12/33  brain]
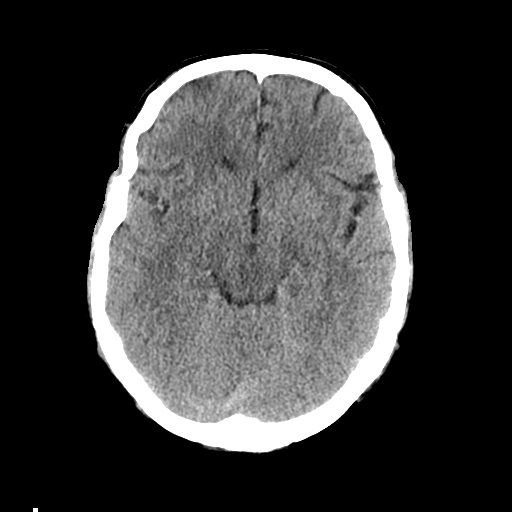
[im 15/33  brain]
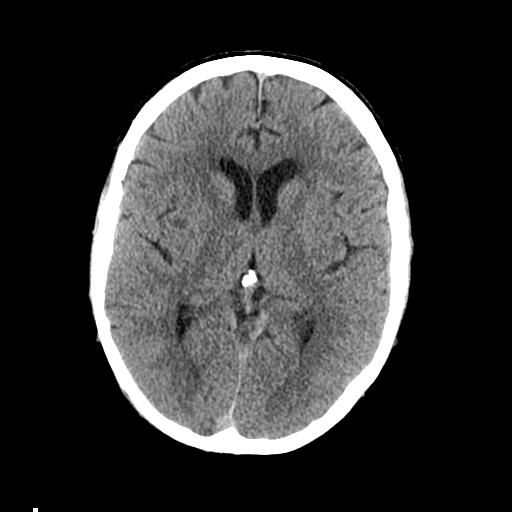
[im 15/33  bone]
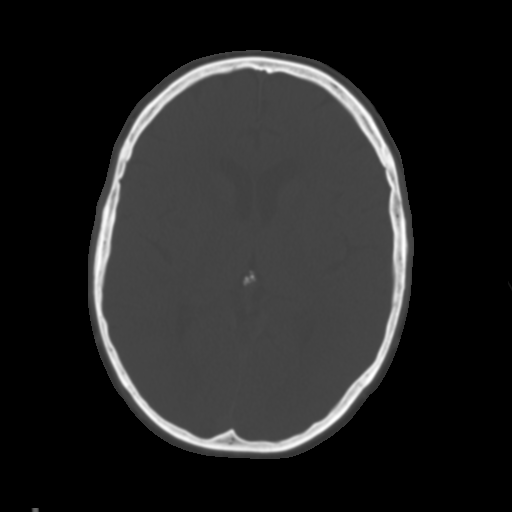
[im 18/33  brain]
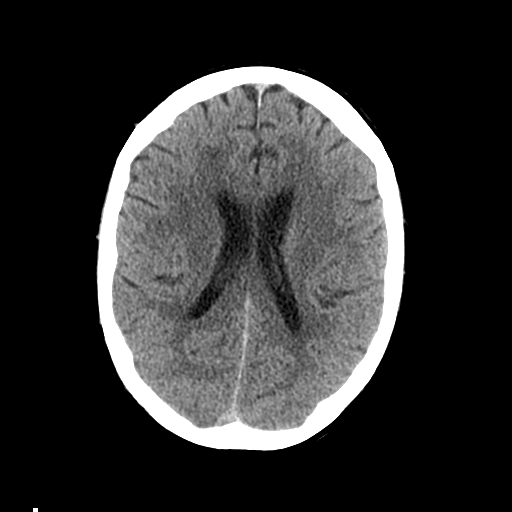
[im 21/33  brain]
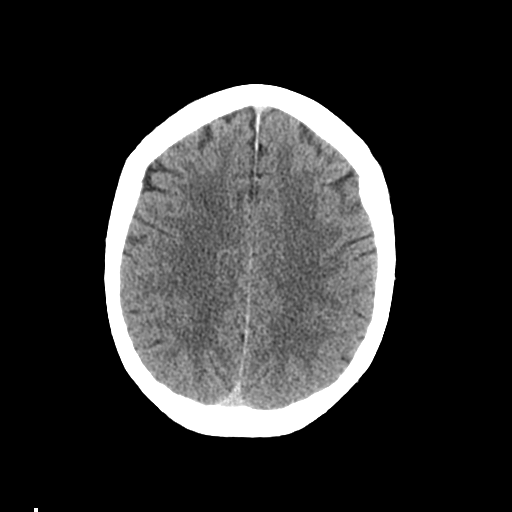
[im 25/33  brain]
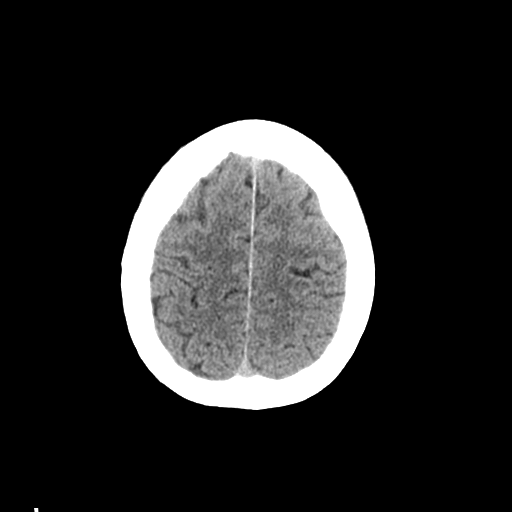
[im 27/33  brain]
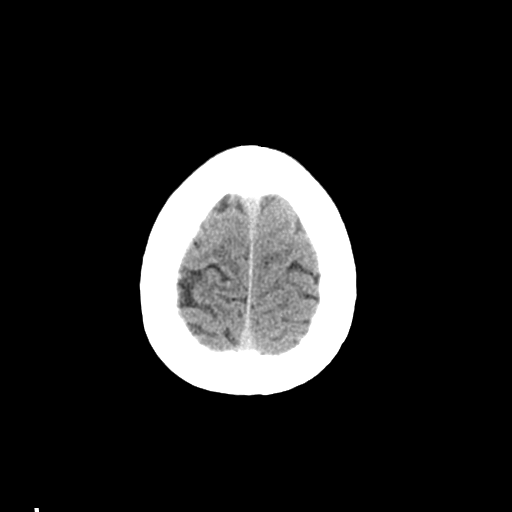
[im 27/33  bone]
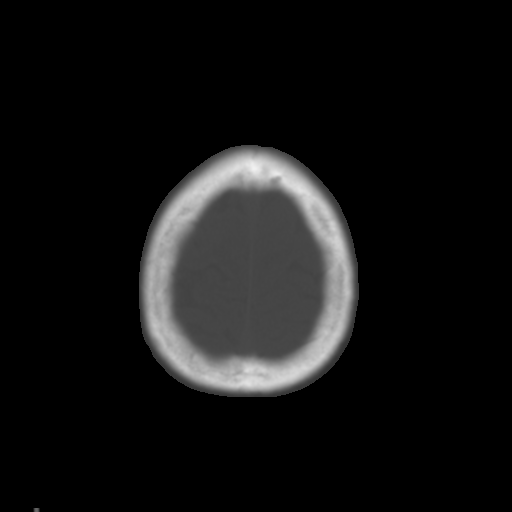
[im 30/33  brain]
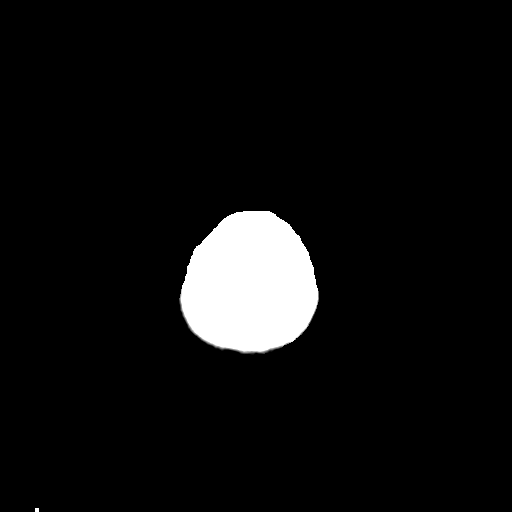

[Series 4: coronal soft · coronal · 0.33mm/px · 3 of 69 slices shown]
[im 23/69  brain]
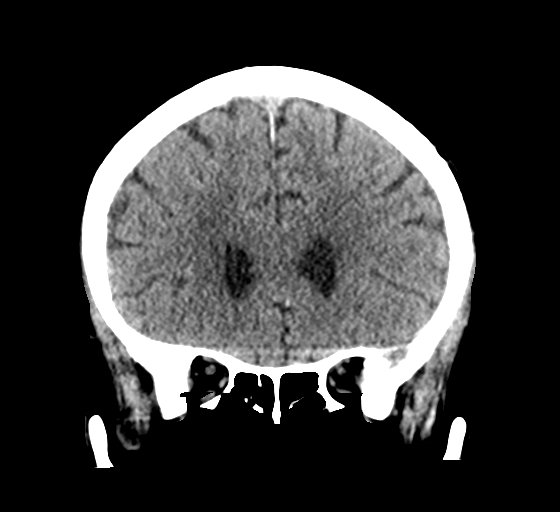
[im 31/69  brain]
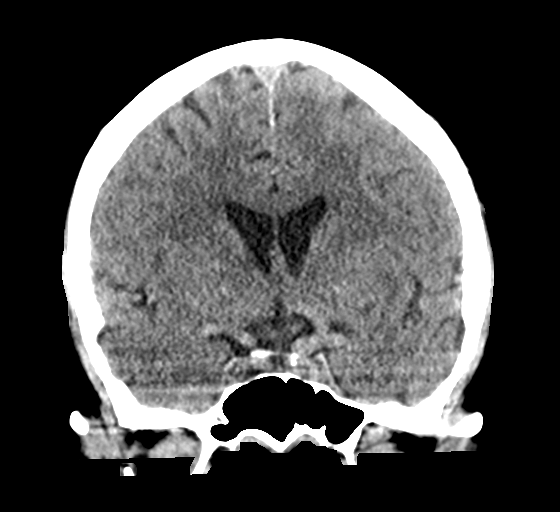
[im 38/69  brain]
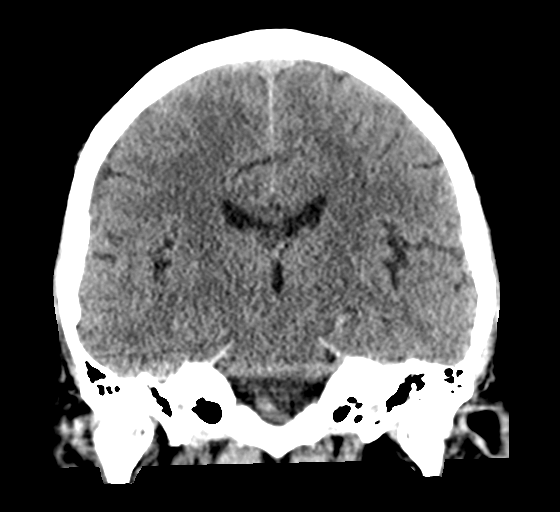

[Series 5: sagittal soft · sagittal · 0.34mm/px · 3 of 63 slices shown]
[im 21/63  brain]
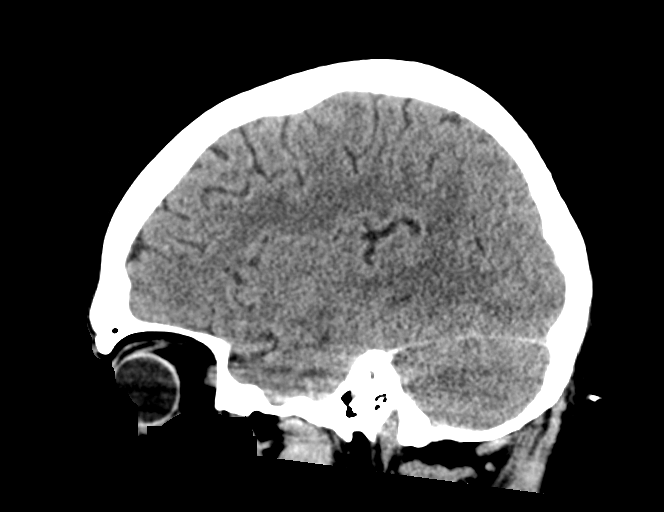
[im 32/63  brain]
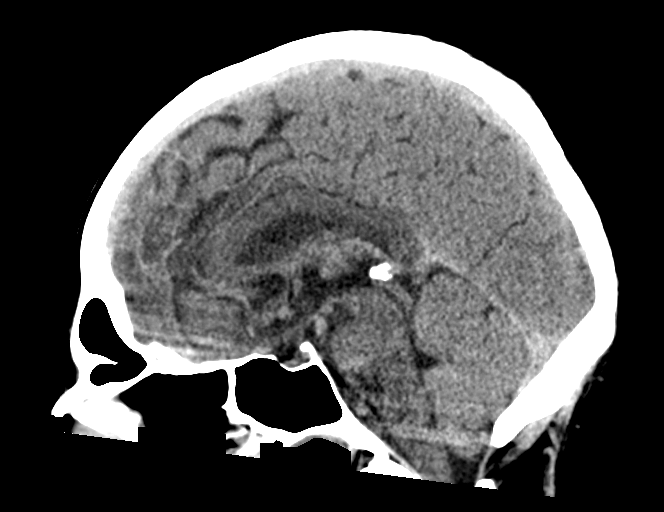
[im 42/63  brain]
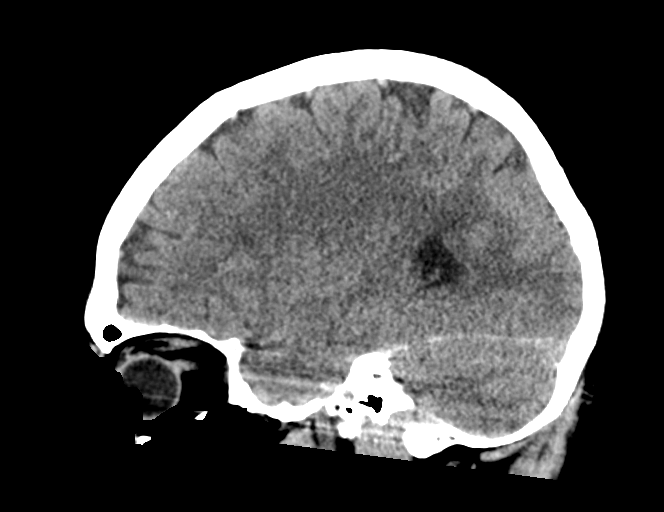

[16 of 47 positions shown; findings below may reference images not displayed]

FINDINGS: Brain: No evidence of acute infarction, hemorrhage, hydrocephalus,
extra-axial collection or mass lesion/mass effect.

Mild periventricular and subcortical white matter hypoattenuation is
noted consistent with chronic microvascular ischemic change.

Vascular: No hyperdense vessel or unexpected calcification.

Skull: Normal. Negative for fracture or focal lesion.

Sinuses/Orbits: Globes and orbits are unremarkable. Visualized
sinuses are clear.

Other: None.
IMPRESSION: 1. No acute intracranial abnormalities.
2. Mild chronic microvascular ischemic change.

## 2022-07-30 DIAGNOSIS — G629 Polyneuropathy, unspecified: Secondary | ICD-10-CM | POA: Diagnosis not present

## 2022-07-30 DIAGNOSIS — M48062 Spinal stenosis, lumbar region with neurogenic claudication: Secondary | ICD-10-CM | POA: Diagnosis not present

## 2022-08-28 ENCOUNTER — Encounter: Payer: Self-pay | Admitting: *Deleted

## 2022-09-01 ENCOUNTER — Other Ambulatory Visit (HOSPITAL_COMMUNITY): Payer: Self-pay | Admitting: Family Medicine

## 2022-09-01 DIAGNOSIS — R911 Solitary pulmonary nodule: Secondary | ICD-10-CM

## 2022-09-02 ENCOUNTER — Ambulatory Visit: Payer: Medicare HMO | Admitting: Diagnostic Neuroimaging

## 2022-09-02 ENCOUNTER — Encounter: Payer: Self-pay | Admitting: Diagnostic Neuroimaging

## 2022-09-02 VITALS — BP 119/71 | HR 69 | Ht 69.0 in | Wt 159.8 lb

## 2022-09-02 DIAGNOSIS — M79662 Pain in left lower leg: Secondary | ICD-10-CM | POA: Diagnosis not present

## 2022-09-02 DIAGNOSIS — M79661 Pain in right lower leg: Secondary | ICD-10-CM | POA: Diagnosis not present

## 2022-09-02 DIAGNOSIS — G629 Polyneuropathy, unspecified: Secondary | ICD-10-CM

## 2022-09-02 NOTE — Progress Notes (Signed)
[facility]  PATIENT: Robert Meza DOB: November 19, 1950  REFERRING CLINICIAN: Judith Part, MD HISTORY FROM: patient REASON FOR VISIT: new consult   HISTORICAL  CHIEF COMPLAINT:  Chief Complaint  Patient presents with   Peripheral Neuropathy    Rm 7 New Pt  dgtr- Crystal  "pain in my legs, not daily and not always both legs, lasts 15-20 min, started 1 1/2 years ago; hx of bad fall in 2007"    HISTORY OF PRESENT ILLNESS:   71 year old male here for evaluation intermittent pain in legs.  Symptoms started around 2 years ago.  Has intermittent sharp shooting pain from his buttocks down his leg, right or left side.  Right side slightly worse than left.  Symptom is last 15 to 20 minutes at a time.  Symptoms worse when he is standing or walking.  Sometimes symptoms can occur when he sitting down on the sofa.  He had MRI of the lumbar spine which showed mild biforaminal stenosis at L4-5 and L5-S1.  Has also had EMG nerve studies demonstrating abnormal peroneal motor responses, otherwise normal studies.  Patient referred here for neuropathy evaluation.   REVIEW OF SYSTEMS: Full 14 system review of systems performed and negative with exception of: as per HPI.  ALLERGIES: Allergies  Allergen Reactions   Amoxicillin Itching    Hot Flashes    HOME MEDICATIONS: Outpatient Medications Prior to Visit  Medication Sig Dispense Refill   acetaminophen (TYLENOL) 500 MG tablet Take 1,000 mg by mouth every 6 (six) hours as needed (pain.).     amLODipine (NORVASC) 10 MG tablet Take 10 mg by mouth at bedtime.     Aspirin-Acetaminophen-Caffeine (GOODYS EXTRA STRENGTH) 500-325-65 MG PACK Take 1 packet by mouth daily as needed (pain.).     chlorthalidone (HYGROTON) 25 MG tablet Take 25 mg by mouth in the morning.     Multiple Vitamin (MULTIVITAMIN WITH MINERALS) TABS tablet Take 2 tablets by mouth in the morning.     olmesartan (BENICAR) 40 MG tablet Take 40 mg by mouth in the  morning.     pravastatin (PRAVACHOL) 20 MG tablet Take 20 mg by mouth at bedtime.     Vitamin D3 (VITAMIN D) 25 MCG tablet Take 2,000 Units by mouth in the morning.     No facility-administered medications prior to visit.    PAST MEDICAL HISTORY: Past Medical History:  Diagnosis Date   Accident at workplace 2007   fell onto concrete, electrocuted   Anxiety    DDD (degenerative disc disease), lumbar    Depression    Diverticulosis    Edentulous    Electric injury 05/12/2006   electricuted while doing electrical work    Head injury 05/12/2012   fall after electicution   History of kidney stones    Hypercholesteremia    Hypertension    Peripheral polyneuropathy     PAST SURGICAL HISTORY: Past Surgical History:  Procedure Laterality Date   CATARACT EXTRACTION W/PHACO  01/15/2012   Procedure: CATARACT EXTRACTION PHACO AND INTRAOCULAR LENS PLACEMENT ([place]);  Surgeon: Tonny Branch, MD;  Location: [facility];  Service: Ophthalmology;  Laterality: Left;  CDE 16.58   CATARACT EXTRACTION W/PHACO Right 10/26/2020   Procedure: CATARACT EXTRACTION PHACO AND INTRAOCULAR LENS PLACEMENT RIGHT EYE;  Surgeon: Baruch Goldmann, MD;  Location: [facility];  Service: Ophthalmology;  Laterality: Right;  CDE  61.64   COLONOSCOPY WITH PROPOFOL N/A 03/31/2022   Procedure: COLONOSCOPY WITH PROPOFOL;  Surgeon: Eloise Harman, DO;  Location: [facility];  Service: Endoscopy;  Laterality: N/A;  9:45am   HERNIA REPAIR  age 55   right inguinal   left ring finger amputation     after electricution    POLYPECTOMY  03/31/2022   Procedure: POLYPECTOMY INTESTINAL;  Surgeon: Eloise Harman, DO;  Location: [facility];  Service: Endoscopy;;   TONSILLECTOMY  age 55   [facility]    FAMILY HISTORY: Family History  Problem Relation Age of Onset   Cancer Mother    Cancer Father    Colon cancer Other    Anesthesia problems Neg Hx    Hypotension Neg Hx    Malignant hyperthermia Neg Hx    Pseudochol deficiency Neg Hx     Diabetes Neg Hx     SOCIAL HISTORY: Social History   Socioeconomic History   Marital status: Married    Spouse name: Not on file   Number of children: 2   Years of education: Not on file   Highest education level: 10th grade  Occupational History    Comment: disabled  Tobacco Use   Smoking status: Every Day    Packs/day: 1.00    Years: 25.00    Total pack years: 25.00    Types: Cigarettes   Smokeless tobacco: Never  Vaping Use   Vaping Use: Never used  Substance and Sexual Activity   Alcohol use: Yes    Comment: 2 beers per week, occasional shot   Drug use: No   Sexual activity: Yes    Birth control/protection: None  Other Topics Concern   Not on file  Social History Narrative   Lives with wife   Social Determinants of Health   Financial Resource Strain: Not on file  Food Insecurity: Not on file  Transportation Needs: Not on file  Physical Activity: Not on file  Stress: Not on file  Social Connections: Not on file  Intimate Partner Violence: Not on file     PHYSICAL EXAM  GENERAL EXAM/CONSTITUTIONAL: Vitals:  Vitals:   09/02/22 1100  BP: 119/71  Pulse: 69  Weight: 159 lb 12.8 oz (72.5 kg)  Height: '5\' 9"'$  (1.753 m)   Body mass index is 23.6 kg/m. Wt Readings from Last 3 Encounters:  09/02/22 159 lb 12.8 oz (72.5 kg)  03/31/22 160 lb (72.6 kg)  03/27/22 161 lb (73 kg)   Patient is in no distress; well developed, nourished and groomed; neck is supple  CARDIOVASCULAR: Examination of carotid arteries is normal; no carotid bruits Regular rate and rhythm, no murmurs Examination of peripheral vascular system by observation and palpation is normal  EYES: Ophthalmoscopic exam of optic discs and posterior segments is normal; no papilledema or hemorrhages No results found.  MUSCULOSKELETAL: Gait, strength, tone, movements noted in Neurologic exam below  NEUROLOGIC: MENTAL STATUS:      No data to display         awake, alert, oriented to person,  place and time recent and remote memory intact normal attention and concentration language fluent, comprehension intact, naming intact fund of knowledge appropriate  CRANIAL NERVE:  2nd - no papilledema on fundoscopic exam 2nd, 3rd, 4th, 6th - pupils equal and reactive to light, visual fields full to confrontation, extraocular muscles intact, no nystagmus 5th - facial sensation symmetric 7th - facial strength symmetric 8th - hearing intact 9th - palate elevates symmetrically, uvula midline 11th - shoulder shrug symmetric 12th - tongue protrusion midline  MOTOR:  normal bulk and tone, full strength in the  BUE, BLE  SENSORY:  normal and symmetric to light touch, temperature, vibration  COORDINATION:  finger-nose-finger, fine finger movements normal  REFLEXES:  deep tendon reflexes 1+ and symmetric  GAIT/STATION:  narrow based gait     DIAGNOSTIC DATA (LABS, IMAGING, TESTING) - I reviewed patient records, labs, notes, testing and imaging myself where available.  Lab Results  Component Value Date   WBC 4.9 03/15/2021   HGB 15.9 03/15/2021   HCT 48.3 03/15/2021   MCV 91.7 03/15/2021   PLT 186 03/15/2021      Component Value Date/Time   NA 136 03/15/2021 0855   K 3.9 03/15/2021 0855   CL 102 03/15/2021 0855   CO2 28 03/15/2021 0855   GLUCOSE 90 03/15/2021 0855   BUN 13 03/15/2021 0855   CREATININE 0.87 03/15/2021 0855   CALCIUM 8.4 (L) 03/15/2021 0855   PROT 6.6 03/15/2021 0855   ALBUMIN 3.7 03/15/2021 0855   AST 31 03/15/2021 0855   ALT 30 03/15/2021 0855   ALKPHOS 69 03/15/2021 0855   BILITOT 0.4 03/15/2021 0855   GFRNONAA >60 03/15/2021 0855   GFRAA >60 05/07/2020 0718   No results found for: "CHOL", "HDL", "LDLCALC", "LDLDIRECT", "TRIG", "CHOLHDL" No results found for: "HGBA1C" No results found for: "VITAMINB12" No results found for: "TSH"  01/30/22 MRI lumbar spine [I reviewed images myself and agree with interpretation. Mild biforaminal stenosis at  L4-5 and L5-S1. -VRP]  1. Mild lower lumbar spine spondylosis as described above. 2. No acute osseous injury of the lumbar spine. L4-L5: Mild broad-based disc bulge. Mild bilateral facet arthropathy. Bilateral subarticular recess narrowing. Mild bilateral foraminal stenosis.   L5-S1: Broad-based disc bulge. Mild bilateral facet arthropathy. Bilateral subarticular recess stenosis. Mild bilateral foraminal stenosis. No spinal stenosis.  03/17/22 EMG/NCS - abnormal peroneal motor responses - normal sural reponses; normal needle emg  06/26/22 ABI Right: Resting right ankle-brachial index is within normal range. The  right toe-brachial index is abnormal.   Left: Resting left ankle-brachial index is within normal range. The left  toe-brachial index is abnormal.   ASSESSMENT AND PLAN  71 y.o. year old male here with:   Dx:  1. Pain in both lower legs   2. Neuropathy      PLAN:  INTERMITTENT LEG PAIN (15-20 minutes; worse with walking; could be related to intermittent lumbar radiculopathy with activity; also some symptoms of intermittent neuropathy vs myopathy / myalgia; electrodiagnostic results only demonstrate mild abnormal peroneal motor responses, which can be seen with lumbar radiculopathies even with normal needle EMG study; also symptoms are intermittent which raises possibility of compression etiologies; proceed with further work-up and lab testing) - check CK, aldolase, and neuropathy labs - pain tx per PCP or pain mgmt clinic  Orders Placed This Encounter  Procedures   CBC with diff   CMP   Vitamin B12   A1c   TSH   SPEP with IFE   ANA w/Reflex   SSA, SSB   HIV   RPR   Hepatitis C antibody   Hepatitis B core antibody, total   Hepatitis B surface antigen   Hepatitis B surface antibody, qualitative   Return for return to PCP, pending test results.    Penni Bombard, MD 42/59/5638, 75:64 AM Certified in Neurology, Neurophysiology and Neuroimaging  [facility] [address], [place], [place] [address] [phone]

## 2022-09-08 LAB — COMPREHENSIVE METABOLIC PANEL
ALT: 24 IU/L (ref 0–44)
AST: 19 IU/L (ref 0–40)
Albumin/Globulin Ratio: 1.5 (ref 1.2–2.2)
Albumin: 4.3 g/dL (ref 3.9–4.9)
Alkaline Phosphatase: 61 IU/L (ref 44–121)
BUN/Creatinine Ratio: 15 (ref 10–24)
BUN: 18 mg/dL (ref 8–27)
Bilirubin Total: 0.2 mg/dL (ref 0.0–1.2)
CO2: 22 mmol/L (ref 20–29)
Calcium: 9.2 mg/dL (ref 8.6–10.2)
Chloride: 104 mmol/L (ref 96–106)
Creatinine, Ser: 1.24 mg/dL (ref 0.76–1.27)
Globulin, Total: 2.8 g/dL (ref 1.5–4.5)
Glucose: 81 mg/dL (ref 70–99)
Potassium: 4.2 mmol/L (ref 3.5–5.2)
Sodium: 141 mmol/L (ref 134–144)
Total Protein: 7.1 g/dL (ref 6.0–8.5)
eGFR: 63 mL/min/{1.73_m2} (ref >59)

## 2022-09-08 LAB — CBC WITH DIFFERENTIAL/PLATELET
Basophils Absolute: 0.1 10*3/uL (ref 0.0–0.2)
Basos: 1 %
EOS (ABSOLUTE): 0.2 10*3/uL (ref 0.0–0.4)
Eos: 2 %
Hematocrit: 40 % (ref 37.5–51.0)
Hemoglobin: 13.6 g/dL (ref 13.0–17.7)
Immature Grans (Abs): 0 10*3/uL (ref 0.0–0.1)
Immature Granulocytes: 0 %
Lymphocytes Absolute: 1.8 10*3/uL (ref 0.7–3.1)
Lymphs: 15 %
MCH: 31.3 pg (ref 26.6–33.0)
MCHC: 34 g/dL (ref 31.5–35.7)
MCV: 92 fL (ref 79–97)
Monocytes Absolute: 0.7 10*3/uL (ref 0.1–0.9)
Monocytes: 6 %
Neutrophils Absolute: 8.9 10*3/uL — ABNORMAL HIGH (ref 1.4–7.0)
Neutrophils: 76 %
Platelets: 284 10*3/uL (ref 150–450)
RBC: 4.34 x10E6/uL (ref 4.14–5.80)
RDW: 12.9 % (ref 11.6–15.4)
WBC: 11.6 10*3/uL — ABNORMAL HIGH (ref 3.4–10.8)

## 2022-09-08 LAB — MULTIPLE MYELOMA PANEL, SERUM
Albumin SerPl Elph-Mcnc: 4.1 g/dL (ref 2.9–4.4)
Albumin/Glob SerPl: 1.4 (ref 0.7–1.7)
Alpha 1: 0.4 g/dL (ref 0.0–0.4)
Alpha2 Glob SerPl Elph-Mcnc: 0.9 g/dL (ref 0.4–1.0)
B-Globulin SerPl Elph-Mcnc: 1 g/dL (ref 0.7–1.3)
Gamma Glob SerPl Elph-Mcnc: 0.8 g/dL (ref 0.4–1.8)
Globulin, Total: 3 g/dL (ref 2.2–3.9)
IgA/Immunoglobulin A, Serum: 195 mg/dL (ref 61–437)
IgG (Immunoglobin G), Serum: 688 mg/dL (ref 603–1613)
IgM (Immunoglobulin M), Srm: 65 mg/dL (ref 20–172)

## 2022-09-08 LAB — ALDOLASE: Aldolase: 6 U/L (ref 3.3–10.3)

## 2022-09-08 LAB — HEPATITIS C ANTIBODY: Hep C Virus Ab: NONREACTIVE

## 2022-09-08 LAB — HIV ANTIBODY (ROUTINE TESTING W REFLEX): HIV Screen 4th Generation wRfx: NONREACTIVE

## 2022-09-08 LAB — RPR: RPR Ser Ql: NONREACTIVE

## 2022-09-08 LAB — SJOGREN'S SYNDROME ANTIBODS(SSA + SSB)
ENA SSA (RO) Ab: 0.6 AI (ref 0.0–0.9)
ENA SSB (LA) Ab: <0.2 AI (ref 0.0–0.9)

## 2022-09-08 LAB — HEPATITIS B CORE ANTIBODY, TOTAL: Hep B Core Total Ab: NEGATIVE

## 2022-09-08 LAB — TSH: TSH: 1.38 u[IU]/mL (ref 0.450–4.500)

## 2022-09-08 LAB — HEMOGLOBIN A1C
Est. average glucose Bld gHb Est-mCnc: 123 mg/dL
Hgb A1c MFr Bld: 5.9 % — ABNORMAL HIGH (ref 4.8–5.6)

## 2022-09-08 LAB — HEPATITIS B SURFACE ANTIGEN: Hepatitis B Surface Ag: NEGATIVE

## 2022-09-08 LAB — VITAMIN B12: Vitamin B-12: 483 pg/mL (ref 232–1245)

## 2022-09-08 LAB — HEPATITIS B SURFACE ANTIBODY,QUALITATIVE: Hep B Surface Ab, Qual: NONREACTIVE

## 2022-09-08 LAB — CK: Total CK: 109 U/L (ref 41–331)

## 2022-09-08 LAB — ANA W/REFLEX: ANA Titer 1: NEGATIVE

## 2022-09-17 ENCOUNTER — Ambulatory Visit (HOSPITAL_COMMUNITY)
Admission: RE | Admit: 2022-09-17 | Discharge: 2022-09-17 | Disposition: A | Discharge status: Home / Self Care | Payer: Medicare HMO | Source: Ambulatory Visit | Attending: Family Medicine | Admitting: Family Medicine

## 2022-09-17 DIAGNOSIS — J439 Emphysema, unspecified: Secondary | ICD-10-CM | POA: Diagnosis not present

## 2022-09-17 DIAGNOSIS — R911 Solitary pulmonary nodule: Secondary | ICD-10-CM | POA: Insufficient documentation

## 2022-09-17 DIAGNOSIS — J479 Bronchiectasis, uncomplicated: Secondary | ICD-10-CM | POA: Diagnosis not present

## 2022-09-23 ENCOUNTER — Other Ambulatory Visit (HOSPITAL_COMMUNITY): Payer: Self-pay | Admitting: Family Medicine

## 2022-09-23 DIAGNOSIS — R911 Solitary pulmonary nodule: Secondary | ICD-10-CM

## 2022-10-09 ENCOUNTER — Ambulatory Visit (HOSPITAL_COMMUNITY)
Admission: RE | Admit: 2022-10-09 | Discharge: 2022-10-09 | Disposition: A | Discharge status: Home / Self Care | Payer: Medicare HMO | Source: Ambulatory Visit | Attending: Family Medicine | Admitting: Family Medicine

## 2022-10-09 DIAGNOSIS — R911 Solitary pulmonary nodule: Secondary | ICD-10-CM | POA: Insufficient documentation

## 2022-10-09 MED ORDER — FLUDEOXYGLUCOSE F - 18 (FDG) INJECTION
8.6600 | Freq: Once | INTRAVENOUS | Status: AC | PRN
  Administered 2022-10-09: 8.66 via INTRAVENOUS

## 2022-11-05 ENCOUNTER — Ambulatory Visit (HOSPITAL_COMMUNITY)
Admission: RE | Admit: 2022-11-05 | Discharge: 2022-11-05 | Disposition: A | Discharge status: Home / Self Care | Payer: Medicare HMO | Source: Ambulatory Visit | Attending: Family Medicine | Admitting: Family Medicine

## 2022-11-05 ENCOUNTER — Other Ambulatory Visit (HOSPITAL_COMMUNITY): Payer: Self-pay | Admitting: Family Medicine

## 2022-11-05 DIAGNOSIS — F17210 Nicotine dependence, cigarettes, uncomplicated: Secondary | ICD-10-CM | POA: Diagnosis not present

## 2022-11-05 DIAGNOSIS — R0600 Dyspnea, unspecified: Secondary | ICD-10-CM

## 2022-11-05 DIAGNOSIS — R0602 Shortness of breath: Secondary | ICD-10-CM | POA: Diagnosis not present

## 2022-11-05 DIAGNOSIS — R911 Solitary pulmonary nodule: Secondary | ICD-10-CM | POA: Diagnosis not present

## 2022-11-05 DIAGNOSIS — J069 Acute upper respiratory infection, unspecified: Secondary | ICD-10-CM | POA: Diagnosis not present

## 2022-12-12 DIAGNOSIS — I10 Essential (primary) hypertension: Secondary | ICD-10-CM | POA: Diagnosis not present

## 2022-12-12 DIAGNOSIS — Z125 Encounter for screening for malignant neoplasm of prostate: Secondary | ICD-10-CM | POA: Diagnosis not present

## 2022-12-18 DIAGNOSIS — Z0001 Encounter for general adult medical examination with abnormal findings: Secondary | ICD-10-CM | POA: Diagnosis not present

## 2022-12-18 DIAGNOSIS — R7303 Prediabetes: Secondary | ICD-10-CM | POA: Diagnosis not present

## 2022-12-18 DIAGNOSIS — Z23 Encounter for immunization: Secondary | ICD-10-CM | POA: Diagnosis not present

## 2022-12-18 DIAGNOSIS — M5441 Lumbago with sciatica, right side: Secondary | ICD-10-CM | POA: Diagnosis not present

## 2022-12-18 DIAGNOSIS — G4700 Insomnia, unspecified: Secondary | ICD-10-CM | POA: Diagnosis not present

## 2022-12-18 DIAGNOSIS — Z0000 Encounter for general adult medical examination without abnormal findings: Secondary | ICD-10-CM | POA: Diagnosis not present

## 2022-12-18 DIAGNOSIS — I10 Essential (primary) hypertension: Secondary | ICD-10-CM | POA: Diagnosis not present

## 2022-12-18 DIAGNOSIS — M5442 Lumbago with sciatica, left side: Secondary | ICD-10-CM | POA: Diagnosis not present

## 2022-12-18 DIAGNOSIS — R42 Dizziness and giddiness: Secondary | ICD-10-CM | POA: Diagnosis not present

## 2023-05-16 IMAGING — DX DG LUMBAR SPINE 2-3V
3 series · 3 of 3 positions shown · non-contrast
Comparison: CT abdomen and pelvis 04/17/2020

CLINICAL DATA: Bilateral low back pain radiating down both legs of
unspecified chronicity, chronic pain worsening in last 6 months.

EXAM:
LUMBAR SPINE - 2-3 VIEW

[l-spine ap]
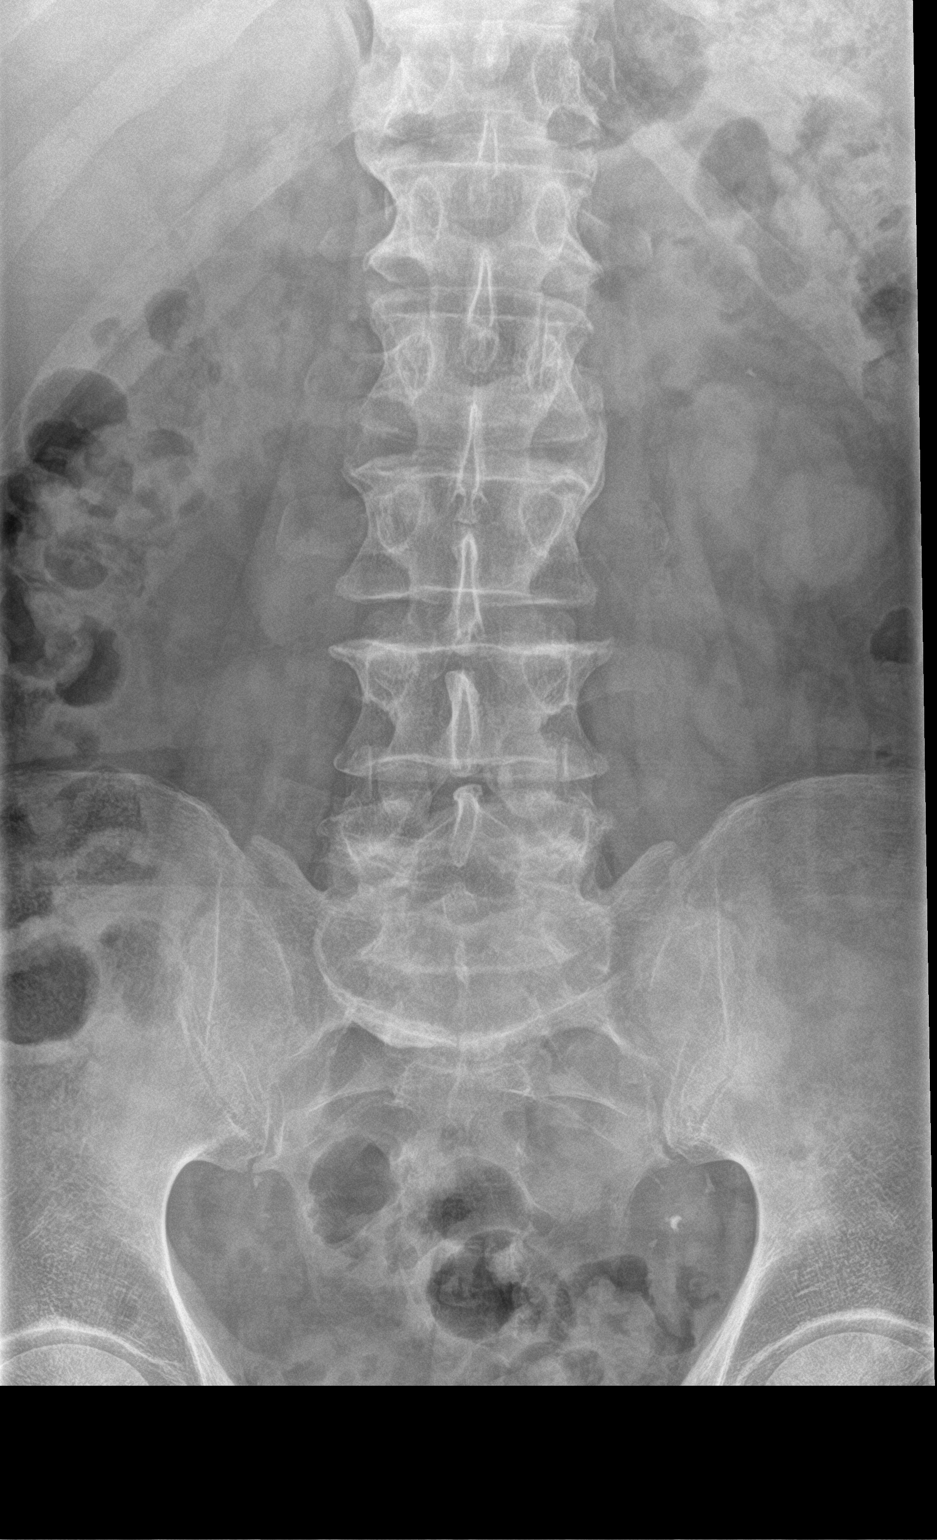

[l-spine lat]
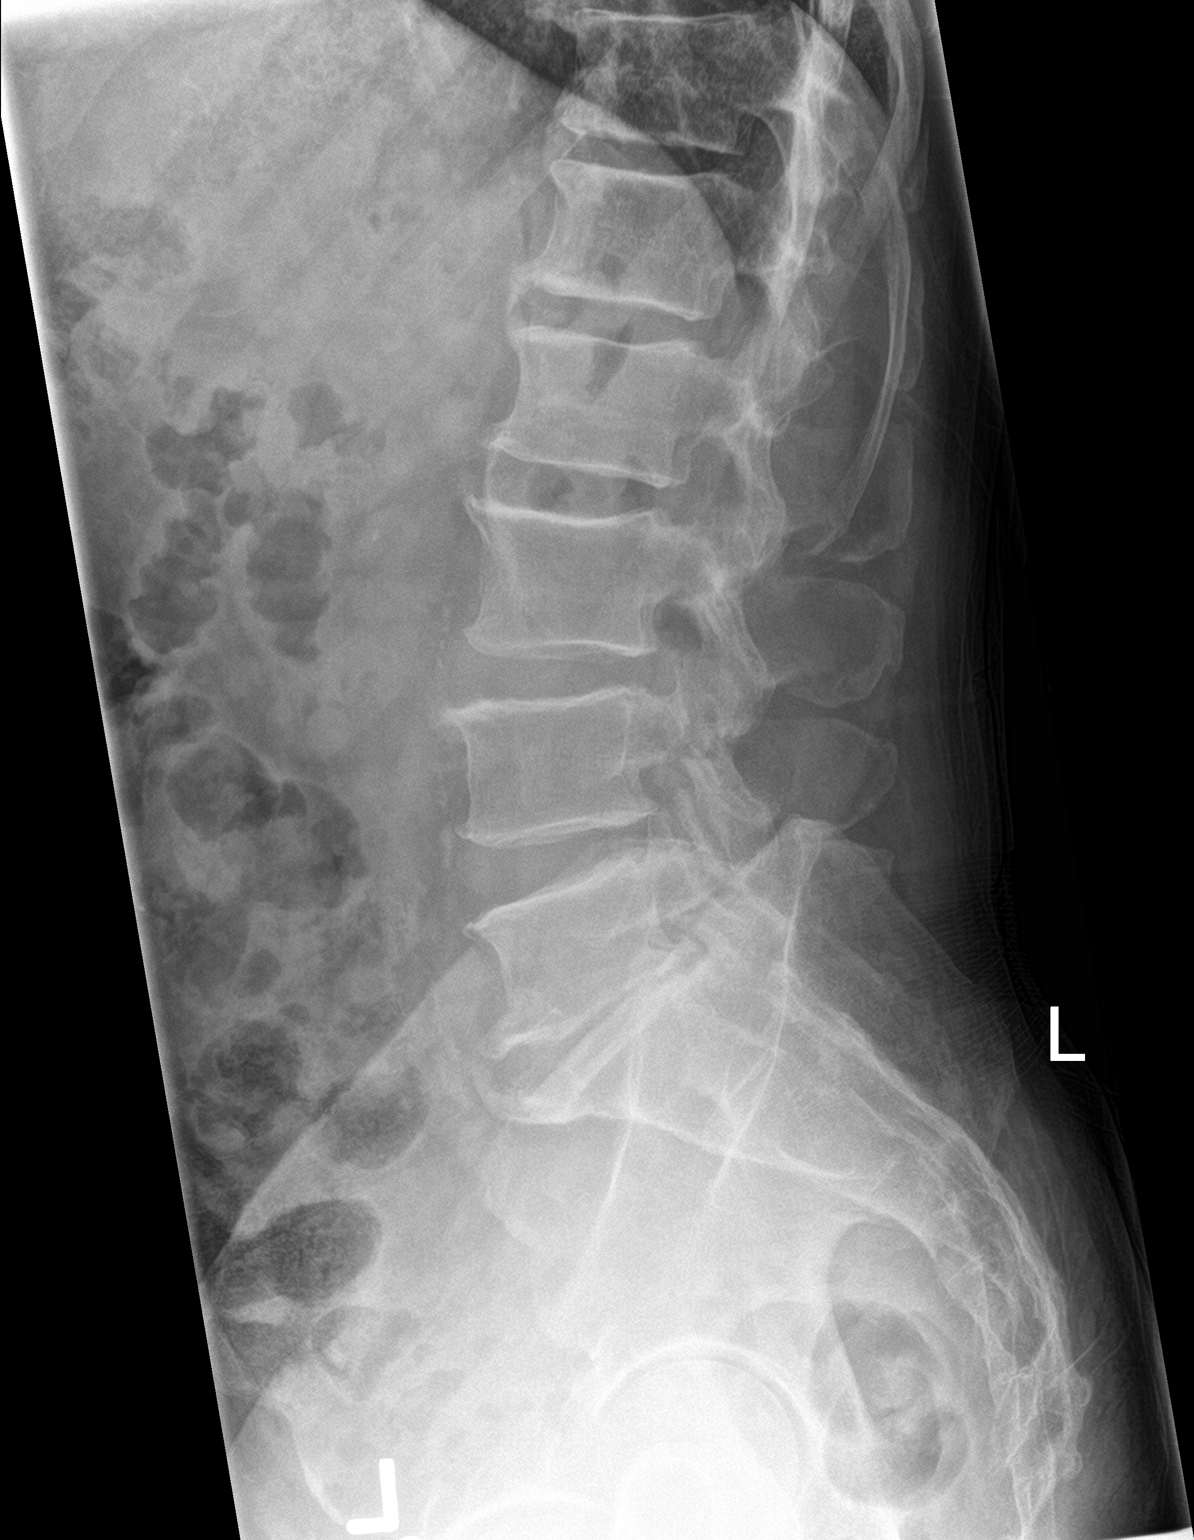

[l-spine spot]
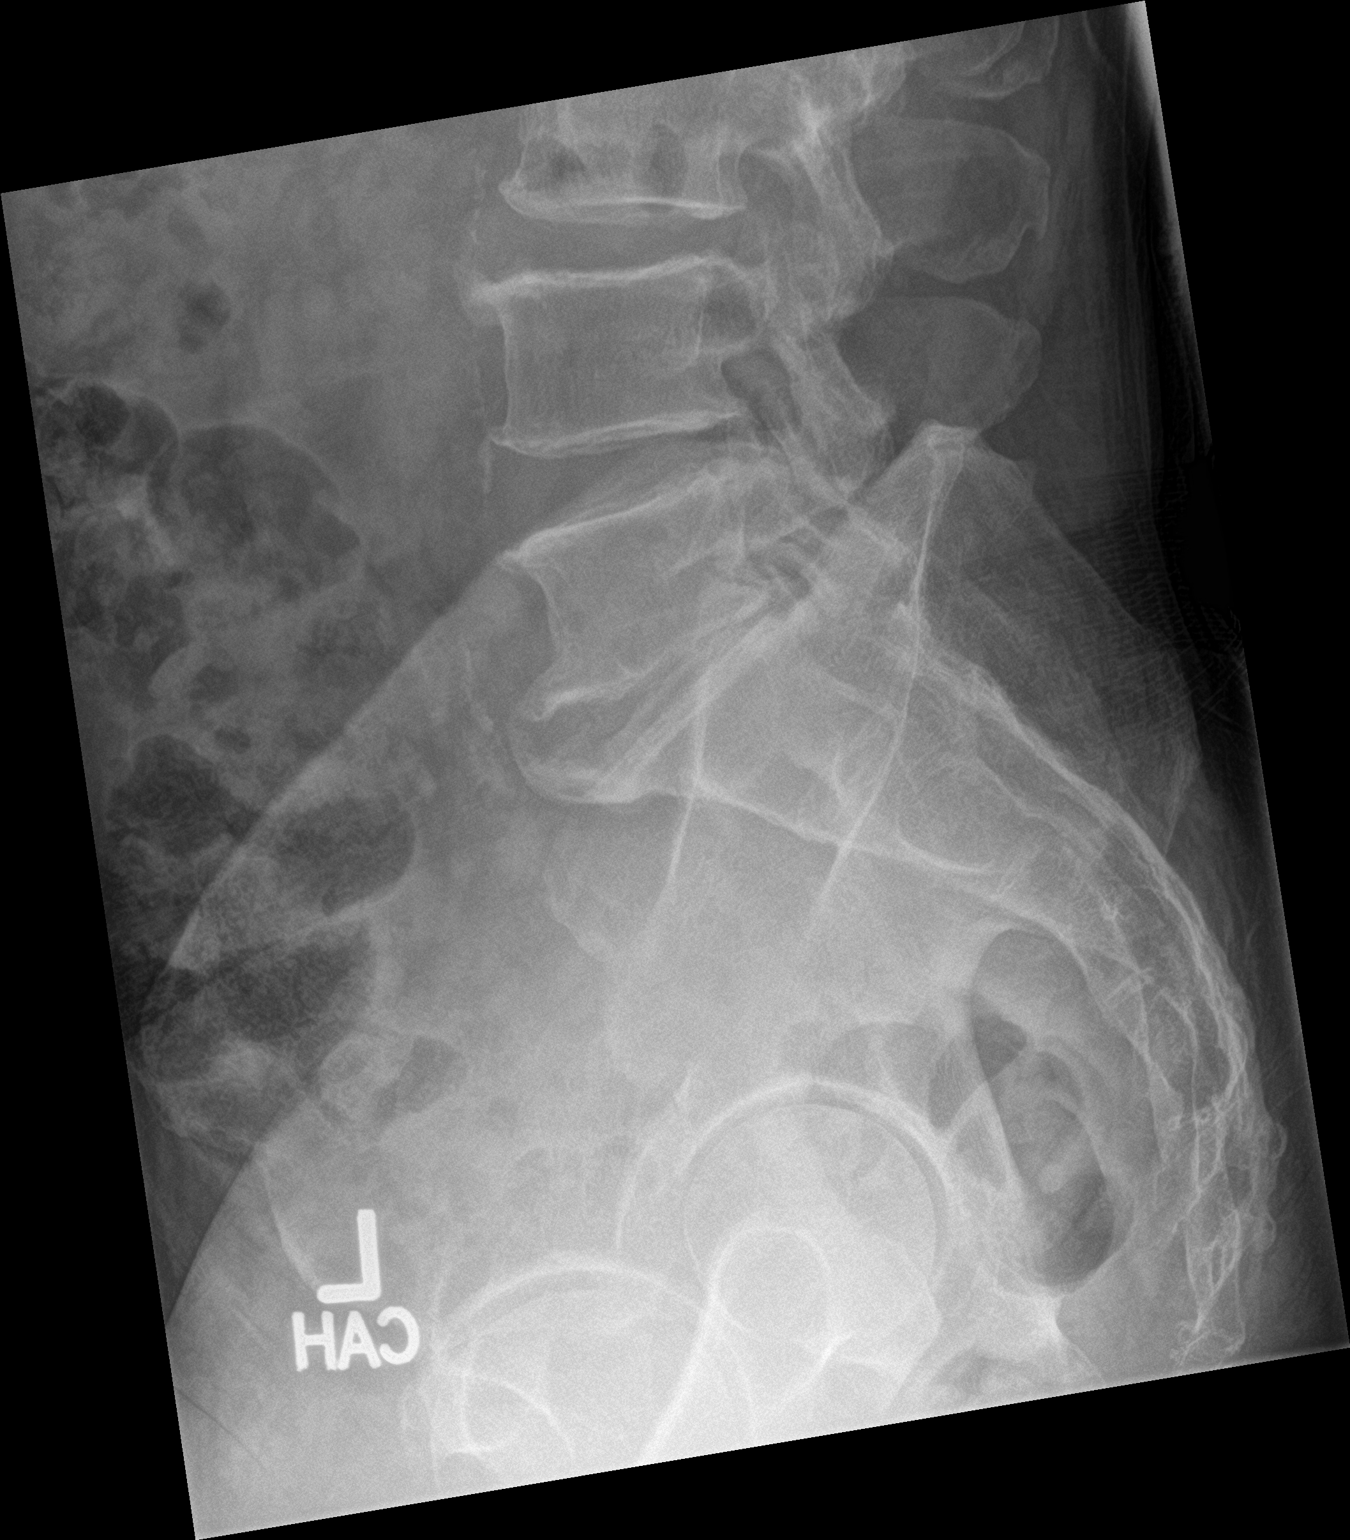

[3 of 3 positions shown; findings below may reference images not displayed]

FINDINGS: Five non-rib-bearing lumbar vertebra.

Disc space narrowing with endplate spur formation L5-S1.

Additional scattered mild endplate spurs throughout lumbar region.

Osseous mineralization low normal.

No fracture, subluxation, or bone destruction.

SI joints preserved.
IMPRESSION: Degenerative disc disease changes lumbar spine greatest at L5-S1.

No acute abnormalities.

## 2023-06-12 DIAGNOSIS — Z125 Encounter for screening for malignant neoplasm of prostate: Secondary | ICD-10-CM | POA: Diagnosis not present

## 2023-06-12 DIAGNOSIS — I10 Essential (primary) hypertension: Secondary | ICD-10-CM | POA: Diagnosis not present

## 2023-06-18 DIAGNOSIS — G4700 Insomnia, unspecified: Secondary | ICD-10-CM | POA: Diagnosis not present

## 2023-06-18 DIAGNOSIS — F17210 Nicotine dependence, cigarettes, uncomplicated: Secondary | ICD-10-CM | POA: Diagnosis not present

## 2023-06-18 DIAGNOSIS — R7303 Prediabetes: Secondary | ICD-10-CM | POA: Diagnosis not present

## 2023-06-18 DIAGNOSIS — M5441 Lumbago with sciatica, right side: Secondary | ICD-10-CM | POA: Diagnosis not present

## 2023-06-18 DIAGNOSIS — M5442 Lumbago with sciatica, left side: Secondary | ICD-10-CM | POA: Diagnosis not present

## 2023-06-18 DIAGNOSIS — R944 Abnormal results of kidney function studies: Secondary | ICD-10-CM | POA: Diagnosis not present

## 2023-06-18 DIAGNOSIS — R42 Dizziness and giddiness: Secondary | ICD-10-CM | POA: Diagnosis not present

## 2023-06-18 DIAGNOSIS — I10 Essential (primary) hypertension: Secondary | ICD-10-CM | POA: Diagnosis not present

## 2023-06-18 DIAGNOSIS — E782 Mixed hyperlipidemia: Secondary | ICD-10-CM | POA: Diagnosis not present

## 2023-10-14 DIAGNOSIS — R319 Hematuria, unspecified: Secondary | ICD-10-CM | POA: Diagnosis not present

## 2023-10-14 DIAGNOSIS — R1030 Lower abdominal pain, unspecified: Secondary | ICD-10-CM | POA: Diagnosis not present

## 2023-10-14 DIAGNOSIS — M542 Cervicalgia: Secondary | ICD-10-CM | POA: Diagnosis not present

## 2023-10-14 DIAGNOSIS — R3911 Hesitancy of micturition: Secondary | ICD-10-CM | POA: Diagnosis not present

## 2023-10-19 DIAGNOSIS — M546 Pain in thoracic spine: Secondary | ICD-10-CM | POA: Diagnosis not present

## 2023-10-19 DIAGNOSIS — M542 Cervicalgia: Secondary | ICD-10-CM | POA: Diagnosis not present

## 2023-10-19 DIAGNOSIS — M9902 Segmental and somatic dysfunction of thoracic region: Secondary | ICD-10-CM | POA: Diagnosis not present

## 2023-10-19 DIAGNOSIS — M9901 Segmental and somatic dysfunction of cervical region: Secondary | ICD-10-CM | POA: Diagnosis not present

## 2023-10-26 DIAGNOSIS — M542 Cervicalgia: Secondary | ICD-10-CM | POA: Diagnosis not present

## 2023-10-26 DIAGNOSIS — M9901 Segmental and somatic dysfunction of cervical region: Secondary | ICD-10-CM | POA: Diagnosis not present

## 2023-10-26 DIAGNOSIS — M546 Pain in thoracic spine: Secondary | ICD-10-CM | POA: Diagnosis not present

## 2023-10-26 DIAGNOSIS — M9902 Segmental and somatic dysfunction of thoracic region: Secondary | ICD-10-CM | POA: Diagnosis not present

## 2023-11-13 DIAGNOSIS — M9901 Segmental and somatic dysfunction of cervical region: Secondary | ICD-10-CM | POA: Diagnosis not present

## 2023-11-13 DIAGNOSIS — M542 Cervicalgia: Secondary | ICD-10-CM | POA: Diagnosis not present

## 2023-11-13 DIAGNOSIS — M546 Pain in thoracic spine: Secondary | ICD-10-CM | POA: Diagnosis not present

## 2023-11-13 DIAGNOSIS — M9902 Segmental and somatic dysfunction of thoracic region: Secondary | ICD-10-CM | POA: Diagnosis not present

## 2023-12-14 DIAGNOSIS — I10 Essential (primary) hypertension: Secondary | ICD-10-CM | POA: Diagnosis not present

## 2023-12-18 ENCOUNTER — Other Ambulatory Visit (HOSPITAL_COMMUNITY): Payer: Self-pay | Admitting: Family Medicine

## 2023-12-18 DIAGNOSIS — Z23 Encounter for immunization: Secondary | ICD-10-CM | POA: Diagnosis not present

## 2023-12-18 DIAGNOSIS — R42 Dizziness and giddiness: Secondary | ICD-10-CM | POA: Diagnosis not present

## 2023-12-18 DIAGNOSIS — L989 Disorder of the skin and subcutaneous tissue, unspecified: Secondary | ICD-10-CM | POA: Diagnosis not present

## 2023-12-18 DIAGNOSIS — Z87891 Personal history of nicotine dependence: Secondary | ICD-10-CM

## 2023-12-18 DIAGNOSIS — I10 Essential (primary) hypertension: Secondary | ICD-10-CM | POA: Diagnosis not present

## 2023-12-18 DIAGNOSIS — G4700 Insomnia, unspecified: Secondary | ICD-10-CM | POA: Diagnosis not present

## 2023-12-18 DIAGNOSIS — R3911 Hesitancy of micturition: Secondary | ICD-10-CM | POA: Diagnosis not present

## 2023-12-18 DIAGNOSIS — E782 Mixed hyperlipidemia: Secondary | ICD-10-CM | POA: Diagnosis not present

## 2023-12-18 DIAGNOSIS — M542 Cervicalgia: Secondary | ICD-10-CM | POA: Diagnosis not present

## 2023-12-18 DIAGNOSIS — M5441 Lumbago with sciatica, right side: Secondary | ICD-10-CM | POA: Diagnosis not present

## 2024-01-22 ENCOUNTER — Ambulatory Visit (HOSPITAL_COMMUNITY)
Admission: RE | Admit: 2024-01-22 | Discharge: 2024-01-22 | Disposition: A | Discharge status: Home / Self Care | Payer: Medicare HMO | Source: Ambulatory Visit | Attending: Family Medicine | Admitting: Family Medicine

## 2024-01-22 DIAGNOSIS — Z87891 Personal history of nicotine dependence: Secondary | ICD-10-CM | POA: Diagnosis not present

## 2024-01-22 DIAGNOSIS — F17210 Nicotine dependence, cigarettes, uncomplicated: Secondary | ICD-10-CM | POA: Diagnosis not present

## 2024-06-10 DIAGNOSIS — R7301 Impaired fasting glucose: Secondary | ICD-10-CM | POA: Diagnosis not present

## 2024-06-10 DIAGNOSIS — I10 Essential (primary) hypertension: Secondary | ICD-10-CM | POA: Diagnosis not present

## 2024-06-17 DIAGNOSIS — R42 Dizziness and giddiness: Secondary | ICD-10-CM | POA: Diagnosis not present

## 2024-06-17 DIAGNOSIS — M5442 Lumbago with sciatica, left side: Secondary | ICD-10-CM | POA: Diagnosis not present

## 2024-06-17 DIAGNOSIS — F17200 Nicotine dependence, unspecified, uncomplicated: Secondary | ICD-10-CM | POA: Diagnosis not present

## 2024-06-17 DIAGNOSIS — I10 Essential (primary) hypertension: Secondary | ICD-10-CM | POA: Diagnosis not present

## 2024-06-17 DIAGNOSIS — E782 Mixed hyperlipidemia: Secondary | ICD-10-CM | POA: Diagnosis not present

## 2024-06-17 DIAGNOSIS — R3911 Hesitancy of micturition: Secondary | ICD-10-CM | POA: Diagnosis not present

## 2024-06-17 DIAGNOSIS — M5441 Lumbago with sciatica, right side: Secondary | ICD-10-CM | POA: Diagnosis not present

## 2024-06-17 DIAGNOSIS — M542 Cervicalgia: Secondary | ICD-10-CM | POA: Diagnosis not present

## 2024-06-17 DIAGNOSIS — R944 Abnormal results of kidney function studies: Secondary | ICD-10-CM | POA: Diagnosis not present
